# Patient Record
Sex: Male | Born: 1980 | Hispanic: Yes | Marital: Single | Smoking: Former smoker
Health system: Southern US, Community
[De-identification: ages and names within clinical notes are randomized; demographics above are authoritative.]

## PROBLEM LIST (undated history)

## (undated) DIAGNOSIS — J45909 Unspecified asthma, uncomplicated: Secondary | ICD-10-CM

---

## 2020-08-05 ENCOUNTER — Emergency Department (HOSPITAL_COMMUNITY): Payer: Self-pay

## 2020-08-05 ENCOUNTER — Emergency Department (HOSPITAL_COMMUNITY)
Admission: EM | Admit: 2020-08-05 | Discharge: 2020-08-05 | Disposition: A | Discharge status: Home / Self Care | Payer: Self-pay | Attending: Emergency Medicine | Admitting: Emergency Medicine

## 2020-08-05 ENCOUNTER — Other Ambulatory Visit: Payer: Self-pay

## 2020-08-05 ENCOUNTER — Encounter (HOSPITAL_COMMUNITY): Payer: Self-pay

## 2020-08-05 DIAGNOSIS — M549 Dorsalgia, unspecified: Secondary | ICD-10-CM | POA: Insufficient documentation

## 2020-08-05 DIAGNOSIS — R059 Cough, unspecified: Secondary | ICD-10-CM | POA: Insufficient documentation

## 2020-08-05 DIAGNOSIS — R079 Chest pain, unspecified: Secondary | ICD-10-CM | POA: Insufficient documentation

## 2020-08-05 MED ORDER — LIDOCAINE 5 % EX PTCH: 2.0000 | MEDICATED_PATCH | Freq: Every day | CUTANEOUS | 0 refills | Status: DC | PRN

## 2020-08-05 MED ORDER — METHOCARBAMOL 500 MG PO TABS: 500.0000 mg | ORAL_TABLET | Freq: Three times a day (TID) | ORAL | 0 refills | Status: DC | PRN

## 2020-08-05 MED ORDER — BENZONATATE 100 MG PO CAPS: 100.0000 mg | ORAL_CAPSULE | Freq: Three times a day (TID) | ORAL | 0 refills | Status: DC

## 2020-08-05 MED ORDER — PREDNISONE 50 MG PO TABS: 50.0000 mg | ORAL_TABLET | Freq: Every day | ORAL | 0 refills | Status: DC

## 2020-08-05 MED ORDER — LIDOCAINE 5 % EX PTCH
3.0000 | MEDICATED_PATCH | CUTANEOUS | Status: DC
  Administered 2020-08-05: 3 via TRANSDERMAL
  Filled 2020-08-05: qty 3

## 2020-08-05 MED ORDER — PREDNISONE 20 MG PO TABS
50.0000 mg | ORAL_TABLET | Freq: Once | ORAL | Status: AC
  Administered 2020-08-05: 50 mg via ORAL
  Filled 2020-08-05: qty 3

## 2020-08-05 NOTE — ED Provider Notes (Signed)
[facility] EMERGENCY DEPARTMENT Provider Note   CSN: [id number] Arrival date & time: 08/05/20  0141     History Chief Complaint  Patient presents with  . Cough  . Flank Pain    William Brandt is a 40 y.o. male with a hx of bronchitis who presents to the ED with complaints of cough x 1 week. Patient reports dry cough with subsequent development of right sided chest/back pain. He states he was having a hard coughing spell and thinks he might have torn a muscle as since then he has had pain to the right chest that is worse with coughing and certain movements, no alleviating factors despite trying several OTC medications. He denies fever, chills, N/V/D, congestion, ear pain, sore throat, syncope, dyspnea,  leg pain/swelling, or hemoptysis.    HPI     History reviewed. No pertinent past medical history.  There are no problems to display for this patient.   History reviewed. No pertinent surgical history.     History reviewed. No pertinent family history.     Home Medications Prior to Admission medications   Not on File    Allergies    Patient has no known allergies.  Review of Systems   Review of Systems  Constitutional: Negative for chills and fever.  HENT: Negative for congestion, ear pain and sore throat.   Respiratory: Positive for cough. Negative for shortness of breath.   Cardiovascular: Positive for chest pain. Negative for leg swelling.  Gastrointestinal: Negative for abdominal pain, diarrhea, nausea and vomiting.  Genitourinary: Negative for dysuria and hematuria.  Musculoskeletal: Positive for back pain.  Neurological: Negative for syncope.  All other systems reviewed and are negative.   Physical Exam Updated Vital Signs BP (!) 125/102 (BP Location: Left Arm)   Pulse 68   Temp 98.4 F (36.9 C)   Resp 15   SpO2 97%   Physical Exam Vitals and nursing note reviewed.  Constitutional:      General: He is not in acute  distress.    Appearance: He is well-developed. He is not toxic-appearing.  HENT:     Head: Normocephalic and atraumatic.     Right Ear: Ear canal normal. Tympanic membrane is not perforated, erythematous, retracted or bulging.     Left Ear: Ear canal normal. Tympanic membrane is not perforated, erythematous, retracted or bulging.     Ears:     Comments: No mastoid erythema/swellng/tenderness.     Nose:     Right Sinus: No maxillary sinus tenderness or frontal sinus tenderness.     Left Sinus: No maxillary sinus tenderness or frontal sinus tenderness.     Mouth/Throat:     Pharynx: Oropharynx is clear. Uvula midline. No oropharyngeal exudate or posterior oropharyngeal erythema.     Comments: Posterior oropharynx is symmetric appearing. Patient tolerating own secretions without difficulty. No trismus. No drooling. No hot potato voice. No swelling beneath the tongue, submandibular compartment is soft.  Eyes:     General:        Right eye: No discharge.        Left eye: No discharge.     Conjunctiva/sclera: Conjunctivae normal.  Cardiovascular:     Rate and Rhythm: Normal rate and regular rhythm.  Pulmonary:     Effort: Pulmonary effort is normal. No respiratory distress.     Breath sounds: Normal breath sounds. No wheezing, rhonchi or rales.  Chest:     Chest wall: Tenderness (right sided anterior, lateral & posterior chest  wall without overlying skin changes or palpable crepitus) present.  Abdominal:     General: There is no distension.     Palpations: Abdomen is soft.     Tenderness: There is no abdominal tenderness. There is no guarding or rebound.  Musculoskeletal:     Cervical back: Neck supple. No rigidity.     Right lower leg: No edema.     Left lower leg: No edema.  Lymphadenopathy:     Cervical: No cervical adenopathy.  Skin:    General: Skin is warm and dry.     Findings: No rash.  Neurological:     Mental Status: He is alert.  Psychiatric:        Behavior: Behavior  normal.     ED Results / Procedures / Treatments   Labs (all labs ordered are listed, but only abnormal results are displayed) Labs Reviewed - No data to display  EKG None  Radiology DG Ribs Unilateral W/Chest Right  Result Date: 08/05/2020 CLINICAL DATA:  Cough, rib pain EXAM: RIGHT RIBS AND CHEST - 3+ VIEW COMPARISON:  None FINDINGS: No fracture or other bone lesions are seen involving the ribs. There is no evidence of pneumothorax or pleural effusion. Both lungs are clear. Heart size and mediastinal contours are within normal limits. IMPRESSION: Negative. Electronically Signed   By: Charlett Nose M.D.   On: 08/05/2020 02:31    Procedures Procedures   Medications Ordered in ED Medications  lidocaine (LIDODERM) 5 % 3 patch (has no administration in time range)  predniSONE (DELTASONE) tablet 50 mg (has no administration in time range)    ED Course  I have reviewed the triage vital signs and the nursing notes.  Pertinent labs & imaging results that were available during my care of the patient were reviewed by me and considered in my medical decision making (see chart for details).    MDM Rules/Calculators/A&P                          Patient presents to the ED with complaints of cough with subsequent chest/back pain. Nontoxic, vitals without significant abnormality- BP elevated some- doubt HTN emergency, PCP recheck.   Additional history obtained:  Additional history obtained from chart review & nursing note review.   Imaging Studies ordered:  CXR was ordered by triage, I independently reviewed, formal radiology impression: Negative.   ED Course:  No signs of HEENT bacterial infection.  CXR without infiltrate to suggest pneumonia.  CXR also without pneumothorax, pulmonary edema, or rib fx.  Low risk wells- doubt PE.   Overall suspect viral vs allergic with subsequent MSK chest pain given pain started with coughing & is reproducible with chest wall palpation. Offered  COVID 19 testing, patient declined. Will tx supportively, PCP follow up. I discussed results, treatment plan, need for follow-up, and return precautions with the patient. Provided opportunity for questions, patient confirmed understanding and is in agreement with plan.   Portions of this note were generated with Scientist, clinical (histocompatibility and immunogenetics). Dictation errors may occur despite best attempts at proofreading.  Final Clinical Impression(s) / ED Diagnoses Final diagnoses:  Cough    Rx / DC Orders ED Discharge Orders         Ordered    predniSONE (DELTASONE) 50 MG tablet  Daily with breakfast        08/05/20 1423    methocarbamol (ROBAXIN) 500 MG tablet  Every 8 hours PRN  08/05/20 1423    benzonatate (TESSALON) 100 MG capsule  Every 8 hours        08/05/20 1423    lidocaine (LIDODERM) 5 %  Daily PRN        08/05/20 1423           Ranny Wiebelhaus, [place] R, PA-C 08/05/20 1427    Maia Plan, MD 08/08/20 1010

## 2020-08-05 NOTE — Discharge Instructions (Addendum)
You were seen in the ER today for cough & chest/back pain.  Your chest x-ray did not show any pneumonia, collapsed lung, or broken ribs. We are sending you home with the following medications to treat your symptoms:   - Prednisone- this is a steroid to help with pain/inflammation. Be sure to take this medication as prescribed with food in the morning.  It should be taken with food, as it can cause stomach upset, and more seriously, stomach bleeding. Do not take other nonsteroidal anti-inflammatory medications with this such as Advil, Motrin, Aleve, Mobic, Goodie Powder, or Motrin.    - Robaxin is the muscle relaxer I have prescribed, this is meant to help with muscle tightness. Be aware that this medication may make you drowsy therefore the first time you take this it should be at a time you are in an environment where you can rest. Do not drive or operate heavy machinery when taking this medication. Do not drink alcohol or take other sedating medications with this medicine such as narcotics or benzodiazepines.   - Lidoderm patch- apply 2 patches to your area of pain once per day. Remove & discard patches within 12 hours of application.   - Tessalon- take every 8 hours as needed for cough.   You make take Tylenol per over the counter dosing with these medications.   We have prescribed you new medication(s) today. Discuss the medications prescribed today with your pharmacist as they can have adverse effects and interactions with your other medicines including over the counter and prescribed medications. Seek medical evaluation if you start to experience new or abnormal symptoms after taking one of these medicines, seek care immediately if you start to experience difficulty breathing, feeling of your throat closing, facial swelling, or rash as these could be indications of a more serious allergic reaction   Please follow up with primary care within 1 week for recheck of your symptoms and for a recheck  of your blood pressure as it was elevated in the Er today.  Return to the Er for new or worsening symptoms including but not limited to trouble breathing, passing out, coughing up blood, fever, new or worsening pain, or any other concerns.

## 2020-08-05 NOTE — ED Provider Notes (Signed)
MSE was initiated and I personally evaluated the patient and placed orders (if any) at  2:08 AM on August 05, 2020.  Patient with CC of cough and right rib pain.  Denies fever.  No respiratory distress.  Appears stable.  Discussed with patient that their care has been initiated.   They are counseled that they will need to remain in the ED until the completion of their workup, including full H&P and results of any tests.  Risks of leaving the emergency department prior to completion of treatment were discussed. Patient was advised to inform ED staff if they are leaving before their treatment is complete. The patient acknowledged these risks and time was allowed for questions.    The patient appears stable so that the remainder of the MSE may be completed by another provider.    Roxy Horseman, PA-C 08/05/20 [id number]    Zadie Rhine, MD 08/06/20 [phone]

## 2020-08-05 NOTE — ED Triage Notes (Signed)
Pt reports he has not been feeling well and tried a bunch of home remedies at home and then develop a severe cough which is now causing right side flank  pain and rib pain

## 2020-10-07 ENCOUNTER — Ambulatory Visit (HOSPITAL_COMMUNITY)
Admission: EM | Admit: 2020-10-07 | Discharge: 2020-10-07 | Disposition: A | Discharge status: Home / Self Care | Payer: Self-pay | Attending: Student | Admitting: Student

## 2020-10-07 ENCOUNTER — Encounter (HOSPITAL_COMMUNITY): Payer: Self-pay | Admitting: Emergency Medicine

## 2020-10-07 DIAGNOSIS — F17200 Nicotine dependence, unspecified, uncomplicated: Secondary | ICD-10-CM

## 2020-10-07 DIAGNOSIS — R053 Chronic cough: Secondary | ICD-10-CM

## 2020-10-07 DIAGNOSIS — J301 Allergic rhinitis due to pollen: Secondary | ICD-10-CM

## 2020-10-07 DIAGNOSIS — S29012A Strain of muscle and tendon of back wall of thorax, initial encounter: Secondary | ICD-10-CM

## 2020-10-07 HISTORY — DX: Unspecified asthma, uncomplicated: J45.909

## 2020-10-07 MED ORDER — ALBUTEROL SULFATE HFA 108 (90 BASE) MCG/ACT IN AERS: 1.0000 | INHALATION_SPRAY | Freq: Four times a day (QID) | RESPIRATORY_TRACT | 0 refills | Status: DC | PRN

## 2020-10-07 MED ORDER — CETIRIZINE HCL 10 MG PO TABS: 10.0000 mg | ORAL_TABLET | Freq: Every day | ORAL | 2 refills | Status: DC

## 2020-10-07 MED ORDER — PREDNISONE 10 MG (21) PO TBPK: ORAL_TABLET | Freq: Every day | ORAL | 0 refills | Status: DC

## 2020-10-07 MED ORDER — TIZANIDINE HCL 2 MG PO CAPS: 2.0000 mg | ORAL_CAPSULE | Freq: Three times a day (TID) | ORAL | 0 refills | Status: DC

## 2020-10-07 NOTE — Discharge Instructions (Addendum)
-  Prednisone taper for muscle strain. I recommend taking this in the morning as it could give you energy.  -Start the muscle relaxer-Zanaflex (tizanidine), up to 3 times daily for muscle spasms and pain.  This can make you drowsy, so take at bedtime or when you do not need to drive or operate machinery. -Albuterol inhaler as needed for cough and shortness of breath, up to every 6 hours. -Trial of Zyrtec allergy medication, take this once daily for 1 week, continue for longer if it is helping. -Seek additional immediate medical attention if you develop the worst pain of your life, new numbness and tingling in your legs, urinary retention, chest pain, shortness of breath, dizziness.  Follow-up with primary care provider if symptoms persist.  I also recommend trying to cut down on your smoking.

## 2020-10-07 NOTE — ED Triage Notes (Signed)
Pt presents today with c/o right lower back pain. He reports hearing something pop when he coughed yesterday.

## 2020-10-07 NOTE — ED Provider Notes (Signed)
[facility]    CSN: [id number] Arrival date & time: 10/07/20  1440      History   Chief Complaint Chief Complaint  Patient presents with  . Back Pain    HPI William Brandt is a 40 y.o. male presenting for chronic cough and back pain. Documented history asthma though patient denies this. He is a current marijuana smoker and was a tobacco smoker until 1 year ago.  Notes months of chronic cough productive of clear sputum and scant nasal congestion.  States the cough is gotten so bad that it occasionally will irritate his back.  He was seen in the emergency department on 08/05/2020 for the symptoms, at that time they prescribed muscle relaxer, prednisone, lidocaine patches with some improvement.  They also did a chest x-ray which was normal.  He is concerned today because he coughed 1 day ago and heard his back pop.  He is currently experiencing right-sided thoracic paraspinous muscle tenderness.  Continues to have this cough. States he used a friend's albuterol inhaler and it was very helpful. Minimal improvement in pain with motrin and lidocaine patches. Denies fevers/chills, n/v/d, shortness of breath, chest pain, facial pain, teeth pain, headaches, sore throat, loss of taste/smell, swollen lymph nodes, ear pain.    HPI  Past Medical History:  Diagnosis Date  . Asthma     There are no problems to display for this patient.   History reviewed. No pertinent surgical history.     Home Medications    Prior to Admission medications   Medication Sig Start Date End Date Taking? Authorizing Provider  albuterol (VENTOLIN HFA) 108 (90 Base) MCG/ACT inhaler Inhale 1-2 puffs into the lungs every 6 (six) hours as needed for wheezing or shortness of breath. 10/07/20  Yes Rhys Martini, PA-C  cetirizine (ZYRTEC ALLERGY) 10 MG tablet Take 1 tablet (10 mg total) by mouth daily. 10/07/20  Yes Rhys Martini, PA-C  lidocaine (LIDODERM) 5 % Place 2 patches onto the skin daily as  needed. Apply patch to area most significant pain once per day.  Remove and discard patch within 12 hours of application. 08/05/20  Yes Petrucelli, Samantha R, PA-C  predniSONE (STERAPRED UNI-PAK 21 TAB) 10 MG (21) TBPK tablet Take by mouth daily. Take 6 tabs by mouth daily  for 2 days, then 5 tabs for 2 days, then 4 tabs for 2 days, then 3 tabs for 2 days, 2 tabs for 2 days, then 1 tab by mouth daily for 2 days 10/07/20  Yes Cheree Ditto, Lyman Speller, PA-C  tizanidine (ZANAFLEX) 2 MG capsule Take 1 capsule (2 mg total) by mouth 3 (three) times daily. 10/07/20  Yes Rhys Martini, PA-C  benzonatate (TESSALON) 100 MG capsule Take 1 capsule (100 mg total) by mouth every 8 (eight) hours. 08/05/20   Petrucelli, Samantha R, PA-C  methocarbamol (ROBAXIN) 500 MG tablet Take 1 tablet (500 mg total) by mouth every 8 (eight) hours as needed for muscle spasms. 08/05/20   Petrucelli, Pleas Koch, PA-C    Family History History reviewed. No pertinent family history.  Social History Social History   Tobacco Use  . Smoking status: Never Smoker  . Smokeless tobacco: Never Used  Vaping Use  . Vaping Use: Never used  Substance Use Topics  . Alcohol use: Not Currently  . Drug use: Not Currently     Allergies   Patient has no known allergies.   Review of Systems Review of Systems  Constitutional: Negative for appetite  change, chills, fever and unexpected weight change.  HENT: Positive for congestion. Negative for ear pain, rhinorrhea, sinus pressure, sinus pain and sore throat.   Eyes: Negative for redness and visual disturbance.  Respiratory: Positive for cough. Negative for chest tightness, shortness of breath and wheezing.   Cardiovascular: Negative for chest pain and palpitations.  Gastrointestinal: Negative for abdominal pain, constipation, diarrhea, nausea and vomiting.  Genitourinary: Negative for decreased urine volume, difficulty urinating, dysuria, frequency and urgency.  Musculoskeletal: Positive for back  pain. Negative for arthralgias, gait problem, joint swelling, myalgias, neck pain and neck stiffness.  Skin: Negative for wound.  Neurological: Negative for dizziness, tremors, seizures, syncope, facial asymmetry, speech difficulty, weakness, light-headedness, numbness and headaches.  Psychiatric/Behavioral: Negative for confusion.  All other systems reviewed and are negative.    Physical Exam Triage Vital Signs ED Triage Vitals  Enc Vitals Group     BP      Pulse      Resp      Temp      Temp src      SpO2      Weight      Height      Head Circumference      Peak Flow      Pain Score      Pain Loc      Pain Edu?      Excl. in GC?    No data found.  Updated Vital Signs BP (!) 128/93 (BP Location: Right Arm)   Pulse 71   Temp 98.2 F (36.8 C) (Oral)   Resp 18   SpO2 98%   Visual Acuity Right Eye Distance:   Left Eye Distance:   Bilateral Distance:    Right Eye Near:   Left Eye Near:    Bilateral Near:     Physical Exam Vitals reviewed.  Constitutional:      General: He is not in acute distress.    Appearance: Normal appearance. He is not ill-appearing.  HENT:     Head: Normocephalic and atraumatic.     Right Ear: Hearing, tympanic membrane, ear canal and external ear normal. No swelling or tenderness. There is no impacted cerumen. No mastoid tenderness. Tympanic membrane is not perforated, erythematous, retracted or bulging.     Left Ear: Hearing, tympanic membrane, ear canal and external ear normal. No swelling or tenderness. There is no impacted cerumen. No mastoid tenderness. Tympanic membrane is not perforated, erythematous, retracted or bulging.     Nose:     Right Sinus: No maxillary sinus tenderness or frontal sinus tenderness.     Left Sinus: No maxillary sinus tenderness or frontal sinus tenderness.     Mouth/Throat:     Mouth: Mucous membranes are moist.     Pharynx: Uvula midline. No oropharyngeal exudate or posterior oropharyngeal erythema.      Tonsils: No tonsillar exudate.  Cardiovascular:     Rate and Rhythm: Normal rate and regular rhythm.     Heart sounds: Normal heart sounds.  Pulmonary:     Effort: Pulmonary effort is normal.     Breath sounds: Normal breath sounds and air entry. No decreased breath sounds, wheezing, rhonchi or rales.     Comments: occ cough Chest:     Chest wall: No tenderness.  Abdominal:     General: Abdomen is flat. Bowel sounds are normal.     Tenderness: There is no abdominal tenderness. There is no right CVA tenderness, left CVA tenderness, guarding or rebound.  Comments: No bowel or bladder incontinence.  Musculoskeletal:     Cervical back: Normal range of motion. No swelling, deformity, signs of trauma, rigidity, spasms, tenderness, bony tenderness or crepitus. No pain with movement.     Thoracic back: No swelling, deformity, signs of trauma, spasms, tenderness or bony tenderness. Normal range of motion. No scoliosis.     Lumbar back: Spasms and tenderness present. No swelling, deformity, signs of trauma or bony tenderness. Normal range of motion. Negative right straight leg raise test and negative left straight leg raise test. No scoliosis.     Comments: R sided thoracic paraspinous muscle tenderness to palpation.  No other spinous or paraspinous tenderness.  No spinous deformity, step-off.  Strength 5 out of 5 in upper and lower extremities, sensation intact.  Negative straight leg raise bilaterally.  Gait intact.  Absolutely no other injury, deformity, tenderness, ecchymosis, abrasion.  Lymphadenopathy:     Cervical: No cervical adenopathy.  Neurological:     General: No focal deficit present.     Mental Status: He is alert and oriented to person, place, and time.     Cranial Nerves: No cranial nerve deficit.  Psychiatric:        Attention and Perception: Attention and perception normal.        Mood and Affect: Mood and affect normal.        Behavior: Behavior normal. Behavior is  cooperative.        Thought Content: Thought content normal.        Judgment: Judgment normal.      UC Treatments / Results  Labs (all labs ordered are listed, but only abnormal results are displayed) Labs Reviewed - No data to display  EKG   Radiology No results found.  Procedures Procedures (including critical care time)  Medications Ordered in UC Medications - No data to display  Initial Impression / Assessment and Plan / UC Course  I have reviewed the triage vital signs and the nursing notes.  Pertinent labs & imaging results that were available during my care of the patient were reviewed by me and considered in my medical decision making (see chart for details).     This patient is a 40 year old male presenting with chronic cough for months and acute onset of chronic back pain.  No red flag symptoms. He is a current marijuana smoker and was a tobacco smoker until 1 year ago. rec cessation.  Suspect there is also an allergic rhinitis component.  Trial of albuterol inhaler, prednisone, zanaflex, zyrtec as below. He is not a diabetic.  Recommended heat, gentle range of motion exercises.  Follow-up with primary care provider if symptoms persist.  Red flag symptoms and ED return precautions discussed.  Final Clinical Impressions(s) / UC Diagnoses   Final diagnoses:  Strain of thoracic back region  Chronic cough  Smoker  Seasonal allergic rhinitis due to pollen     Discharge Instructions     -Prednisone taper for muscle strain. I recommend taking this in the morning as it could give you energy.  -Start the muscle relaxer-Zanaflex (tizanidine), up to 3 times daily for muscle spasms and pain.  This can make you drowsy, so take at bedtime or when you do not need to drive or operate machinery. -Albuterol inhaler as needed for cough and shortness of breath, up to every 6 hours. -Trial of Zyrtec allergy medication, take this once daily for 1 week, continue for longer if it is  helping. -Seek additional immediate medical  attention if you develop the worst pain of your life, new numbness and tingling in your legs, urinary retention, chest pain, shortness of breath, dizziness.  Follow-up with primary care provider if symptoms persist.  I also recommend trying to cut down on your smoking.    ED Prescriptions    Medication Sig Dispense Auth. Provider   predniSONE (STERAPRED UNI-PAK 21 TAB) 10 MG (21) TBPK tablet Take by mouth daily. Take 6 tabs by mouth daily  for 2 days, then 5 tabs for 2 days, then 4 tabs for 2 days, then 3 tabs for 2 days, 2 tabs for 2 days, then 1 tab by mouth daily for 2 days 42 tablet Rhys MartiniGraham, Zanyla Klebba E, PA-C   tizanidine (ZANAFLEX) 2 MG capsule Take 1 capsule (2 mg total) by mouth 3 (three) times daily. 21 capsule Rhys MartiniGraham, Ferrell Flam E, PA-C   cetirizine (ZYRTEC ALLERGY) 10 MG tablet Take 1 tablet (10 mg total) by mouth daily. 30 tablet Rhys MartiniGraham, Simmone [place] E, PA-C   albuterol (VENTOLIN HFA) 108 (90 Base) MCG/ACT inhaler Inhale 1-2 puffs into the lungs every 6 (six) hours as needed for wheezing or shortness of breath. 1 each Rhys MartiniGraham, Jovonne Wilton E, PA-C     PDMP not reviewed this encounter.   Rhys MartiniGraham, Rikia Sukhu E, PA-C 10/07/20 1629

## 2020-12-06 ENCOUNTER — Ambulatory Visit
Admission: EM | Admit: 2020-12-06 | Discharge: 2020-12-06 | Disposition: A | Discharge status: Home / Self Care | Payer: Self-pay | Attending: Family Medicine | Admitting: Family Medicine

## 2020-12-06 ENCOUNTER — Other Ambulatory Visit: Payer: Self-pay

## 2020-12-06 DIAGNOSIS — R053 Chronic cough: Secondary | ICD-10-CM

## 2020-12-06 DIAGNOSIS — J4541 Moderate persistent asthma with (acute) exacerbation: Secondary | ICD-10-CM

## 2020-12-06 DIAGNOSIS — S29019A Strain of muscle and tendon of unspecified wall of thorax, initial encounter: Secondary | ICD-10-CM

## 2020-12-06 DIAGNOSIS — J3089 Other allergic rhinitis: Secondary | ICD-10-CM

## 2020-12-06 DIAGNOSIS — F17210 Nicotine dependence, cigarettes, uncomplicated: Secondary | ICD-10-CM

## 2020-12-06 MED ORDER — PREDNISONE 20 MG PO TABS: 40.0000 mg | ORAL_TABLET | Freq: Every day | ORAL | 0 refills | Status: DC

## 2020-12-06 MED ORDER — PROMETHAZINE-DM 6.25-15 MG/5ML PO SYRP: 5.0000 mL | ORAL_SOLUTION | Freq: Four times a day (QID) | ORAL | 0 refills | Status: DC | PRN

## 2020-12-06 MED ORDER — ALBUTEROL SULFATE HFA 108 (90 BASE) MCG/ACT IN AERS: 1.0000 | INHALATION_SPRAY | Freq: Four times a day (QID) | RESPIRATORY_TRACT | 2 refills | Status: DC | PRN

## 2020-12-06 MED ORDER — CETIRIZINE HCL 10 MG PO TABS: 10.0000 mg | ORAL_TABLET | Freq: Every day | ORAL | 2 refills | Status: DC

## 2020-12-06 MED ORDER — MONTELUKAST SODIUM 10 MG PO TABS: 10.0000 mg | ORAL_TABLET | Freq: Every day | ORAL | 2 refills | Status: DC

## 2020-12-06 MED ORDER — CYCLOBENZAPRINE HCL 10 MG PO TABS: 10.0000 mg | ORAL_TABLET | Freq: Three times a day (TID) | ORAL | 0 refills | Status: DC | PRN

## 2020-12-06 MED ORDER — BUDESONIDE-FORMOTEROL FUMARATE 160-4.5 MCG/ACT IN AERO: 2.0000 | INHALATION_SPRAY | Freq: Two times a day (BID) | RESPIRATORY_TRACT | 2 refills | Status: DC

## 2020-12-06 NOTE — ED Triage Notes (Signed)
Pt c/o chronic cough. States yesterday coughed and felted a pop to rt ribs. Pt states hx of same a month ago and received treatment in the ED.

## 2020-12-06 NOTE — ED Provider Notes (Addendum)
[facility]    CSN: [id number] Arrival date & time: 12/06/20  0825      History   Chief Complaint Chief Complaint  Patient presents with   Cough   Back Pain    HPI William Brandt is a 40 y.o. male.   Patient presenting today with acute on chronic hacking cough, wheezing, chest tightness.  States is been ongoing for most of his life, known history of asthma, allergies, chronic cigarette smoking and is supposed to be on maintenance inhalers but keeps running out of them.  He is also out of his Zyrtec for his allergies.  He states he coughs so hard that he pulls muscles in his mid to lower back, worse on the right side.  Muscle relaxers do help temporarily but once he starts coughing again the pain returns.  Using lidocaine Patches and ibuprofen with mild temporary relief.  Denies fever, chills, body aches, chest pain, significant shortness of breath, abdominal pain, nausea vomiting or diarrhea, Radiation of pain down legs, numbness, weakness, tingling, bowel or bladder incontinence, saddle paresthesias, sick contacts.  Past Medical History:  Diagnosis Date   Asthma    There are no problems to display for this patient.  History reviewed. No pertinent surgical history.     Home Medications    Prior to Admission medications   Medication Sig Start Date End Date Taking? Authorizing Provider  albuterol (VENTOLIN HFA) 108 (90 Base) MCG/ACT inhaler Inhale 1-2 puffs into the lungs every 6 (six) hours as needed for wheezing or shortness of breath. 12/06/20  Yes Particia Nearing, PA-C  budesonide-formoterol [facility]) 160-4.5 MCG/ACT inhaler Inhale 2 puffs into the lungs 2 (two) times daily. 12/06/20  Yes Particia Nearing, PA-C  cyclobenzaprine (FLEXERIL) 10 MG tablet Take 1 tablet (10 mg total) by mouth 3 (three) times daily as needed for muscle spasms. Do not drink alcohol or drive while taking this medication.  May cause drowsiness. 12/06/20  Yes Particia Nearing, PA-C  montelukast (SINGULAIR) 10 MG tablet Take 1 tablet (10 mg total) by mouth at bedtime. 12/06/20  Yes Particia Nearing, PA-C  predniSONE (DELTASONE) 20 MG tablet Take 2 tablets (40 mg total) by mouth daily with breakfast. 12/06/20  Yes Particia Nearing, PA-C  promethazine-dextromethorphan (PROMETHAZINE-DM) 6.25-15 MG/5ML syrup Take 5 mLs by mouth 4 (four) times daily as needed for cough. 12/06/20  Yes Particia Nearing, PA-C  cetirizine (ZYRTEC ALLERGY) 10 MG tablet Take 1 tablet (10 mg total) by mouth daily. 12/06/20   Particia Nearing, PA-C  lidocaine (LIDODERM) 5 % Place 2 patches onto the skin daily as needed. Apply patch to area most significant pain once per day.  Remove and discard patch within 12 hours of application. 08/05/20   Petrucelli, Pleas Koch, PA-C   Family History History reviewed. No pertinent family history.  Social History Social History   Tobacco Use   Smoking status: Never   Smokeless tobacco: Never  Vaping Use   Vaping Use: Never used  Substance Use Topics   Alcohol use: Not Currently   Drug use: Not Currently     Allergies   Patient has no known allergies.   Review of Systems Review of Systems Per HPI  Physical Exam Triage Vital Signs ED Triage Vitals  Enc Vitals Group     BP 12/06/20 0905 (!) 139/103     Pulse Rate 12/06/20 0905 83     Resp 12/06/20 0905 18     Temp 12/06/20  0905 98.3 F (36.8 C)     Temp Source 12/06/20 0905 Oral     SpO2 12/06/20 0905 95 %     Weight --      Height --      Head Circumference --      Peak Flow --      Pain Score 12/06/20 0906 10     Pain Loc --      Pain Edu? --      Excl. in GC? --    No data found.  Updated Vital Signs BP (!) 139/103 (BP Location: Left Arm)   Pulse 83   Temp 98.3 F (36.8 C) (Oral)   Resp 18   SpO2 95%   Visual Acuity Right Eye Distance:   Left Eye Distance:   Bilateral Distance:    Right Eye Near:   Left Eye Near:    Bilateral Near:      Physical Exam Vitals and nursing note reviewed.  Constitutional:      Appearance: Normal appearance.  HENT:     Head: Atraumatic.     Right Ear: Tympanic membrane normal.     Left Ear: Tympanic membrane normal.     Nose: Rhinorrhea present.     Mouth/Throat:     Mouth: Mucous membranes are moist.     Pharynx: Posterior oropharyngeal erythema present. No oropharyngeal exudate.  Eyes:     Extraocular Movements: Extraocular movements intact.     Conjunctiva/sclera: Conjunctivae normal.  Cardiovascular:     Rate and Rhythm: Normal rate and regular rhythm.     Heart sounds: Normal heart sounds.  Pulmonary:     Effort: Pulmonary effort is normal. No respiratory distress.     Breath sounds: Wheezing present. No rales.     Comments: Mild diffuse wheezes Abdominal:     General: Bowel sounds are normal. There is no distension.     Palpations: Abdomen is soft.     Tenderness: There is no abdominal tenderness. There is no guarding.  Musculoskeletal:        General: Tenderness present. Normal range of motion.     Cervical back: Normal range of motion and neck supple.     Comments: Moderate to severe tenderness to palpation over right thoracic and lumbar musculature laterally, antalgic movements at the torso.  Negative midline spinal tenderness palpation, negative straight leg raise  Skin:    General: Skin is warm and dry.     Findings: No bruising, erythema or rash.  Neurological:     General: No focal deficit present.     Mental Status: He is oriented to person, place, and time.     Motor: No weakness.     Gait: Gait normal.  Psychiatric:        Mood and Affect: Mood normal.        Thought Content: Thought content normal.        Judgment: Judgment normal.   UC Treatments / Results  Labs (all labs ordered are listed, but only abnormal results are displayed) Labs Reviewed - No data to display  EKG  Radiology No results found.  Procedures Procedures (including critical care  time)  Medications Ordered in UC Medications - No data to display  Initial Impression / Assessment and Plan / UC Course  I have reviewed the triage vital signs and the nursing notes.  Pertinent labs & imaging results that were available during my care of the patient were reviewed by me and considered in my  medical decision making (see chart for details).     Cough secondary to uncontrolled seasonal allergies, asthma, likely COPD given his chronic smoking history.  Chest x-rays done at recent ED and urgent care visits negative for acute abnormality, no worsening symptoms since then.  Will restart albuterol, Symbicort for maintenance inhalers and Zyrtec, Singulair for allergy regimen.  Phenergan DM, prednisone burst, muscle relaxer additionally for symptomatic benefit.  Discussed importance of following up with a primary care provider for ongoing refills and med management.  Follow-up if worsening or not resolving.  Final Clinical Impressions(s) / UC Diagnoses   Final diagnoses:  Chronic cough  Seasonal allergic rhinitis due to other allergic trigger  Cigarette smoker  Thoracic myofascial strain, initial encounter  Moderate persistent asthma with acute exacerbation   Discharge Instructions   None    ED Prescriptions     Medication Sig Dispense Auth. Provider   cetirizine (ZYRTEC ALLERGY) 10 MG tablet Take 1 tablet (10 mg total) by mouth daily. 30 tablet Particia Nearing, PA-C   montelukast (SINGULAIR) 10 MG tablet Take 1 tablet (10 mg total) by mouth at bedtime. 30 tablet Particia Nearing, [place]   predniSONE (DELTASONE) 20 MG tablet Take 2 tablets (40 mg total) by mouth daily with breakfast. 10 tablet Particia Nearing, PA-C   albuterol (VENTOLIN HFA) 108 (90 Base) MCG/ACT inhaler Inhale 1-2 puffs into the lungs every 6 (six) hours as needed for wheezing or shortness of breath. 18 g Particia Nearing, PA-C   budesonide-formoterol [facility]) 160-4.5 MCG/ACT  inhaler Inhale 2 puffs into the lungs 2 (two) times daily. 1 each Particia Nearing, PA-C   promethazine-dextromethorphan (PROMETHAZINE-DM) 6.25-15 MG/5ML syrup Take 5 mLs by mouth 4 (four) times daily as needed for cough. 100 mL Particia Nearing, PA-C   cyclobenzaprine (FLEXERIL) 10 MG tablet Take 1 tablet (10 mg total) by mouth 3 (three) times daily as needed for muscle spasms. Do not drink alcohol or drive while taking this medication.  May cause drowsiness. 30 tablet Particia Nearing, [place]      PDMP not reviewed this encounter.   Particia Nearing, PA-C 12/06/20 0947    Particia Nearing, PA-C 12/06/20 [phone]

## 2020-12-23 ENCOUNTER — Other Ambulatory Visit: Payer: Self-pay | Admitting: Family Medicine

## 2020-12-23 NOTE — Telephone Encounter (Signed)
Seen at UC.

## 2021-10-29 IMAGING — CR DG RIBS W/ CHEST 3+V*R*
5 series · 5 of 5 positions shown · non-contrast
Comparison: None

CLINICAL DATA: Cough, rib pain

EXAM:
RIGHT RIBS AND CHEST - 3+ VIEW

[chest pa]
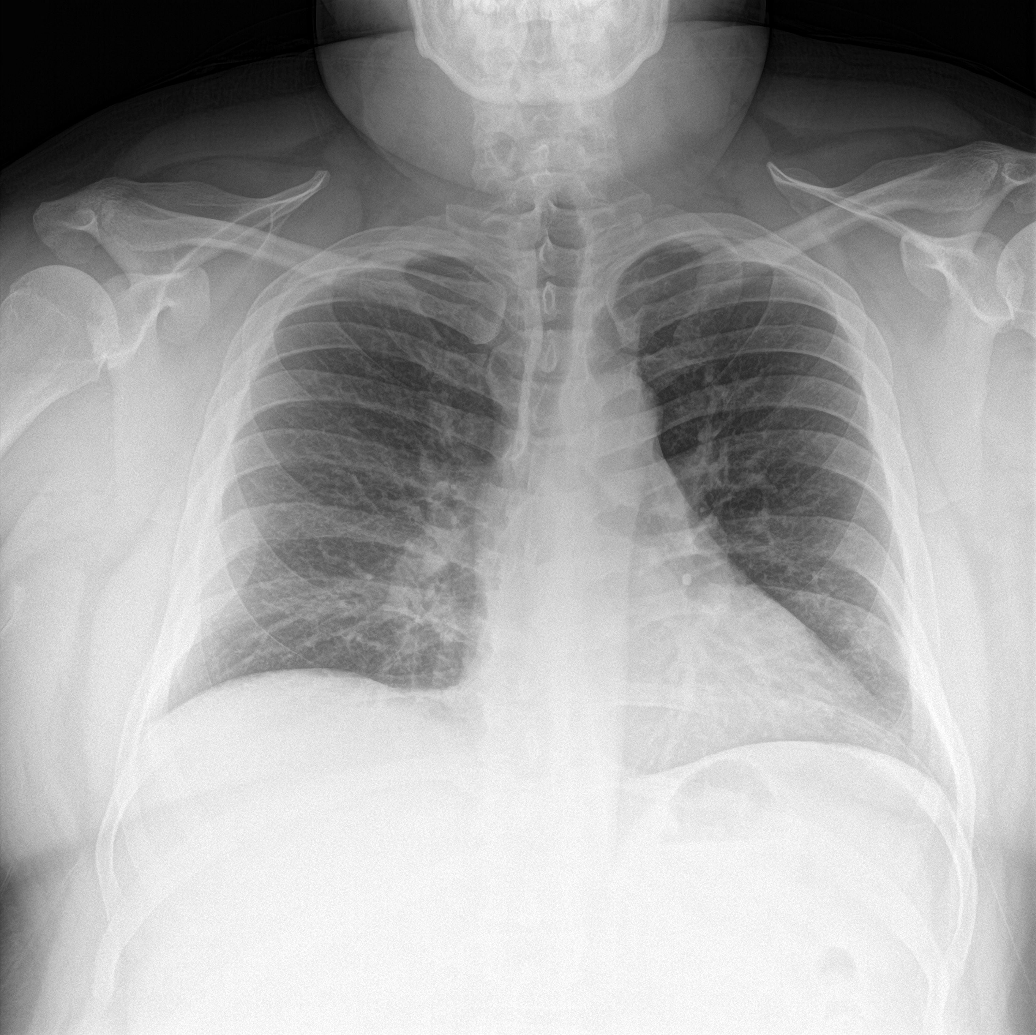

[rib ap (1 of 2)]
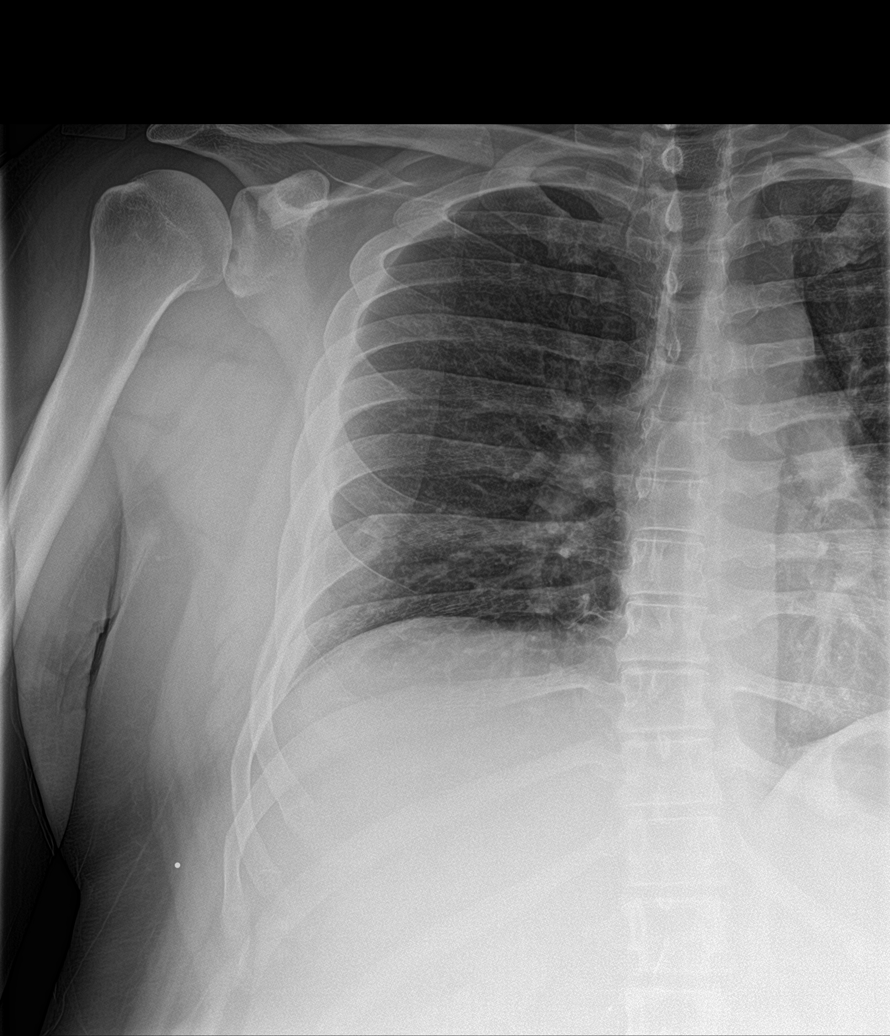

[rib ap obl (1 of 2)]
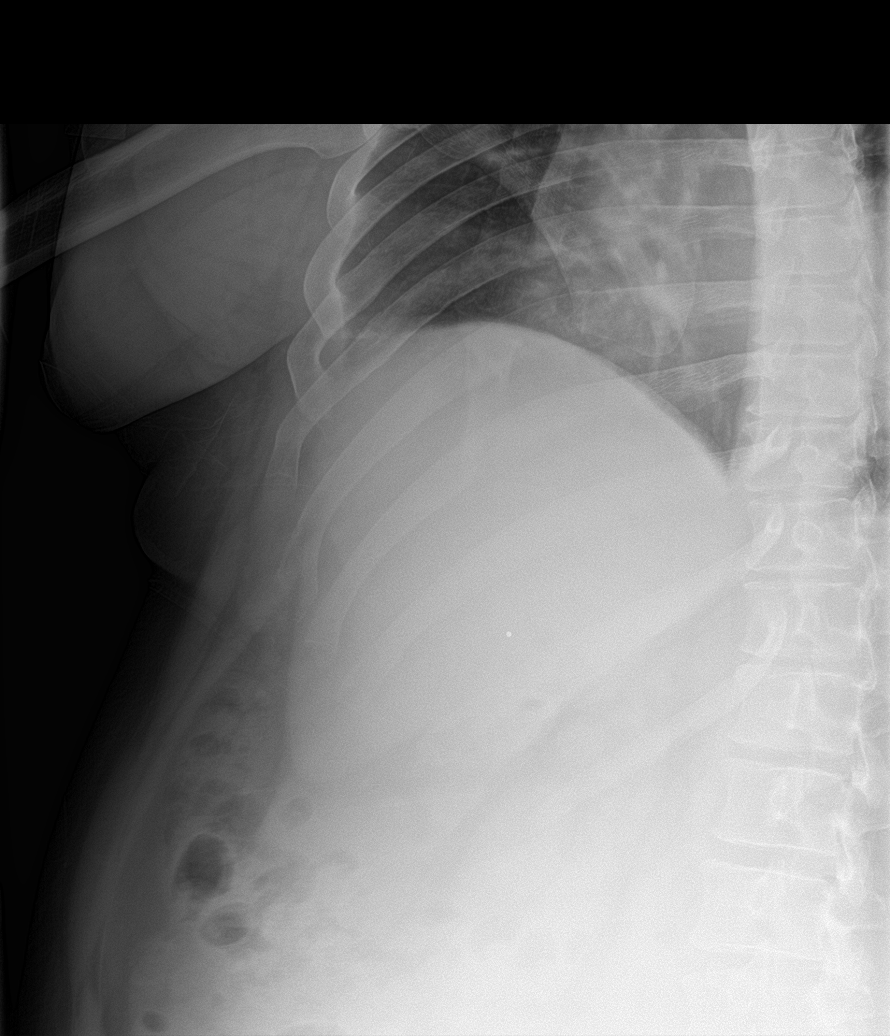

[rib ap (2 of 2)]
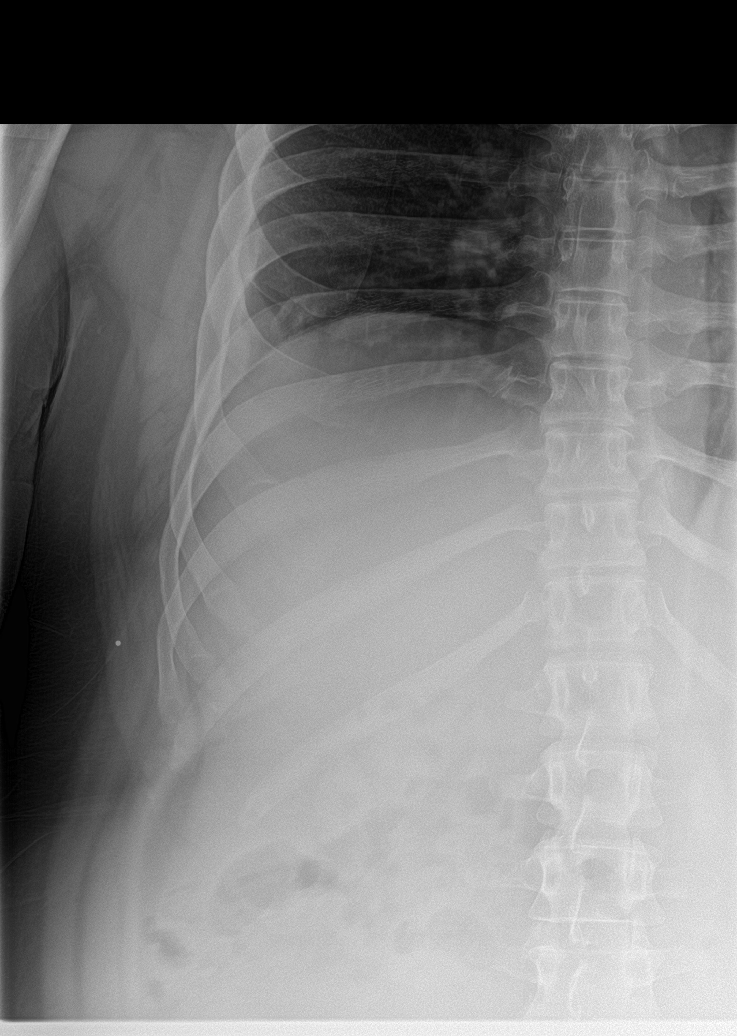

[rib ap obl (2 of 2)]
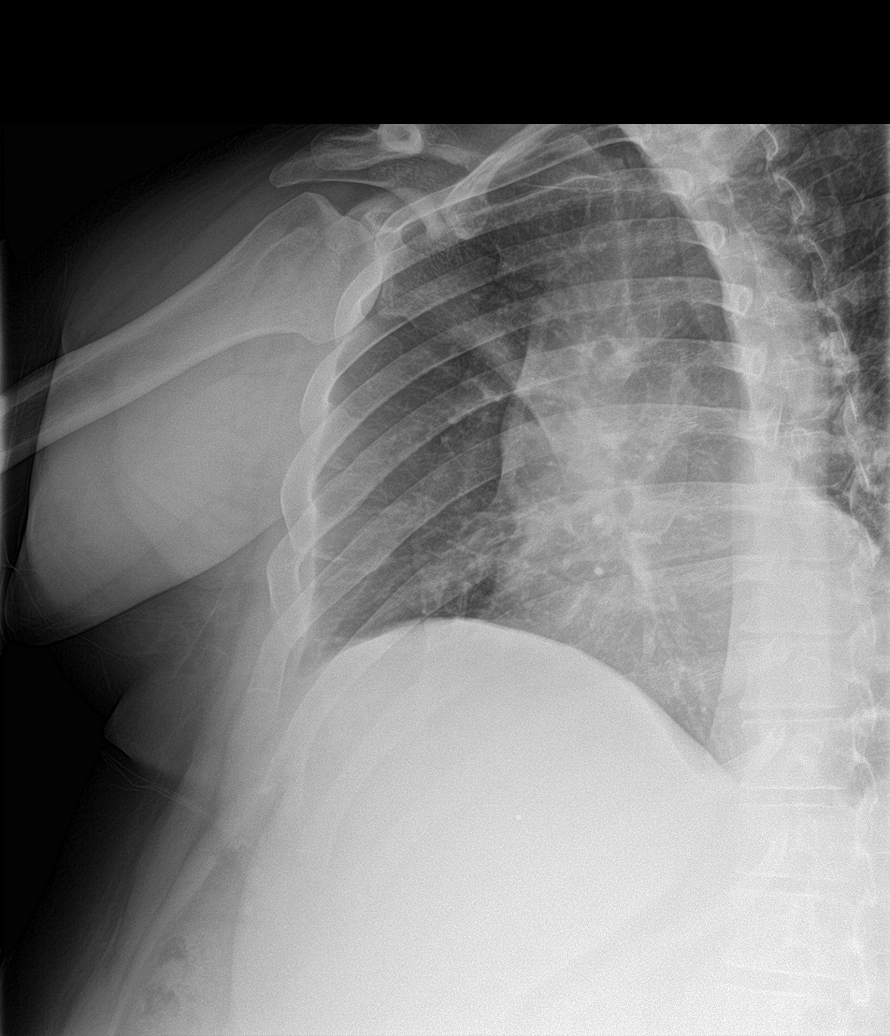

[5 of 5 positions shown; findings below may reference images not displayed]

FINDINGS: No fracture or other bone lesions are seen involving the ribs. There
is no evidence of pneumothorax or pleural effusion. Both lungs are
clear. Heart size and mediastinal contours are within normal limits.
IMPRESSION: Negative.

## 2022-04-28 ENCOUNTER — Ambulatory Visit
Admission: EM | Admit: 2022-04-28 | Discharge: 2022-04-28 | Disposition: A | Discharge status: Home / Self Care | Payer: Managed Care, Other (non HMO) | Attending: Physician Assistant | Admitting: Physician Assistant

## 2022-04-28 ENCOUNTER — Encounter: Payer: Self-pay | Admitting: Physician Assistant

## 2022-04-28 DIAGNOSIS — J4521 Mild intermittent asthma with (acute) exacerbation: Secondary | ICD-10-CM | POA: Diagnosis not present

## 2022-04-28 MED ORDER — PREDNISONE 10 MG (21) PO TBPK
ORAL_TABLET | ORAL | 0 refills | Status: DC
Start: 2022-04-28 — End: 2022-04-28

## 2022-04-28 MED ORDER — ALBUTEROL SULFATE HFA 108 (90 BASE) MCG/ACT IN AERS: 1.0000 | INHALATION_SPRAY | Freq: Four times a day (QID) | RESPIRATORY_TRACT | 2 refills | Status: DC | PRN

## 2022-04-28 MED ORDER — MONTELUKAST SODIUM 10 MG PO TABS: 10.0000 mg | ORAL_TABLET | Freq: Every day | ORAL | 2 refills | Status: DC

## 2022-04-28 MED ORDER — PREDNISONE 10 MG (21) PO TBPK: ORAL_TABLET | ORAL | 0 refills | Status: DC

## 2022-04-28 MED ORDER — BUDESONIDE-FORMOTEROL FUMARATE 160-4.5 MCG/ACT IN AERO: 2.0000 | INHALATION_SPRAY | Freq: Two times a day (BID) | RESPIRATORY_TRACT | 2 refills | Status: DC

## 2022-04-28 MED ORDER — BUDESONIDE-FORMOTEROL FUMARATE 160-4.5 MCG/ACT IN AERO
2.0000 | INHALATION_SPRAY | Freq: Two times a day (BID) | RESPIRATORY_TRACT | 2 refills | Status: DC
Start: 2022-04-28 — End: 2022-08-08

## 2022-04-28 NOTE — ED Triage Notes (Signed)
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

## 2022-04-28 NOTE — Discharge Instructions (Signed)
Use albuterol every 4-6 hours as needed.  Start prednisone taper as prescribed.  Do not take NSAIDs with this medication including aspirin, ibuprofen/Advil, naproxen/Aleve.  Use Symbicort twice daily.  Make sure to rinse your mouth following use of this medication to prevent thrush.  Take mildly Daily.  I recommend over-the-counter medications including Flonase and antihistamine such as cetirizine to help with your symptoms.  If your symptoms or not improving or if anything worsens you need to be seen immediately.

## 2022-04-28 NOTE — ED Provider Notes (Signed)
[facility]    CSN: [id number] Arrival date & time: 04/28/22  1244      History   Chief Complaint Chief Complaint  Patient presents with   Cough    HPI William Brandt is a 41 y.o. male.   Patient presents today with a 4 to 5-day history of URI symptoms including cough and congestion.  He reports that he was diagnosed with asthma when he was younger but has not required albuterol inhaler until recently.  He was seen by our clinic in August 2022 at which point he was prescribed several medications including montelukast, cetirizine, prednisone, Symbicort, albuterol which provided significant improvement of symptoms.  Unfortunately, he is without this medication and believes that the dust exposure at his employer has triggered his asthma/allergies again.  He is requesting refills of these medications.  He denies any known sick contacts.  Denies fever, nausea, vomiting, headache, dizziness, weakness.  He denies hospitalization related to asthma in the past.  Denies history of diabetes.    Past Medical History:  Diagnosis Date   Asthma     There are no problems to display for this patient.   History reviewed. No pertinent surgical history.     Home Medications    Prior to Admission medications   Medication Sig Start Date End Date Taking? Authorizing Provider  predniSONE (STERAPRED UNI-PAK 21 TAB) 10 MG (21) TBPK tablet As directed 04/28/22  Yes Elorah Dewing K, PA-C  albuterol (VENTOLIN HFA) 108 (90 Base) MCG/ACT inhaler Inhale 1-2 puffs into the lungs every 6 (six) hours as needed for wheezing or shortness of breath. 04/28/22   Dayona Shaheen, Noberto Retort, PA-C  budesonide-formoterol (SYMBICORT) 160-4.5 MCG/ACT inhaler Inhale 2 puffs into the lungs 2 (two) times daily. 04/28/22   Vernal Hritz, Noberto Retort, PA-C  cetirizine (ZYRTEC ALLERGY) 10 MG tablet Take 1 tablet (10 mg total) by mouth daily. 12/06/20   Particia Nearing, PA-C  montelukast (SINGULAIR) 10 MG tablet Take 1  tablet (10 mg total) by mouth at bedtime. 04/28/22   Amen Dargis, Noberto Retort, PA-C    Family History History reviewed. No pertinent family history.  Social History Social History   Tobacco Use   Smoking status: Never   Smokeless tobacco: Never  Vaping Use   Vaping Use: Never used  Substance Use Topics   Alcohol use: Not Currently   Drug use: Not Currently     Allergies   Patient has no known allergies.   Review of Systems Review of Systems  Constitutional:  Positive for activity change. Negative for appetite change, fatigue and fever.  HENT:  Positive for congestion. Negative for sinus pressure, sneezing and sore throat.   Respiratory:  Positive for cough and shortness of breath.   Cardiovascular:  Negative for chest pain.  Gastrointestinal:  Negative for abdominal pain, diarrhea, nausea and vomiting.  Neurological:  Negative for dizziness, light-headedness and headaches.     Physical Exam Triage Vital Signs ED Triage Vitals  Enc Vitals Group     BP 04/28/22 1452 120/78     Pulse Rate 04/28/22 1452 73     Resp 04/28/22 1452 18     Temp 04/28/22 1452 98.2 F (36.8 C)     Temp Source 04/28/22 1452 Oral     SpO2 04/28/22 1452 96 %     Weight --      Height --      Head Circumference --      Peak Flow --  Pain Score 04/28/22 1451 0     Pain Loc --      Pain Edu? --      Excl. in GC? --    No data found.  Updated Vital Signs BP 120/78 (BP Location: Left Arm)   Pulse 73   Temp 98.2 F (36.8 C) (Oral)   Resp 18   SpO2 96%   Visual Acuity Right Eye Distance:   Left Eye Distance:   Bilateral Distance:    Right Eye Near:   Left Eye Near:    Bilateral Near:     Physical Exam Vitals reviewed.  Constitutional:      General: He is awake.     Appearance: Normal appearance. He is well-developed. He is not ill-appearing.     Comments: Very pleasant male appears stated age in no acute distress sitting comfortably in exam room  HENT:     Head: Normocephalic  and atraumatic.     Right Ear: Tympanic membrane, ear canal and external ear normal. Tympanic membrane is not erythematous or bulging.     Left Ear: Tympanic membrane, ear canal and external ear normal. Tympanic membrane is not erythematous or bulging.     Nose: Nose normal.     Right Sinus: No maxillary sinus tenderness or frontal sinus tenderness.     Left Sinus: No maxillary sinus tenderness or frontal sinus tenderness.     Mouth/Throat:     Pharynx: Uvula midline. No oropharyngeal exudate or posterior oropharyngeal erythema.  Cardiovascular:     Rate and Rhythm: Normal rate and regular rhythm.     Heart sounds: Normal heart sounds, S1 normal and S2 normal. No murmur heard. Pulmonary:     Effort: Pulmonary effort is normal. No accessory muscle usage or respiratory distress.     Breath sounds: No stridor. Examination of the right-lower field reveals decreased breath sounds. Examination of the left-lower field reveals decreased breath sounds. Decreased breath sounds present. No wheezing, rhonchi or rales.  Neurological:     Mental Status: He is alert.  Psychiatric:        Behavior: Behavior is cooperative.      UC Treatments / Results  Labs (all labs ordered are listed, but only abnormal results are displayed) Labs Reviewed - No data to display  EKG   Radiology No results found.  Procedures Procedures (including critical care time)  Medications Ordered in UC Medications - No data to display  Initial Impression / Assessment and Plan / UC Course  I have reviewed the triage vital signs and the nursing notes.  Pertinent labs & imaging results that were available during my care of the patient were reviewed by me and considered in my medical decision making (see chart for details).     Patient is well-appearing, afebrile, nontoxic, nontachycardic.  Viral testing was deferred as he has been symptomatic for 4 to 5 days and this would not change her management by the time we  have results.  No evidence of acute infection on physical exam that would warrant initiation of antibiotics.  Concern for asthma exacerbation.  Patient was restarted on maintenance medication including montelukast and Symbicort.  Refill of albuterol sent to pharmacy.  Will start prednisone taper.  Discussed that he should not take NSAIDs with this medication due to risk of GI bleeding but can use over-the-counter medication including antihistamines, Flonase, Mucinex for symptom relief.  He is to rest and drink plenty of fluid.  Discussed that if his symptoms are not  improving within a few days of starting medication he should be reevaluated.  If anything worsens and he has shortness of breath, weakness, chest pain, fever he needs to be seen immediately.  Strict return precautions given to which she expressed understanding.  Final Clinical Impressions(s) / UC Diagnoses   Final diagnoses:  Mild intermittent asthma with acute exacerbation     Discharge Instructions      Use albuterol every 4-6 hours as needed.  Start prednisone taper as prescribed.  Do not take NSAIDs with this medication including aspirin, ibuprofen/Advil, naproxen/Aleve.  Use Symbicort twice daily.  Make sure to rinse your mouth following use of this medication to prevent thrush.  Take mildly Daily.  I recommend over-the-counter medications including Flonase and antihistamine such as cetirizine to help with your symptoms.  If your symptoms or not improving or if anything worsens you need to be seen immediately.     ED Prescriptions     Medication Sig Dispense Auth. Provider   albuterol (VENTOLIN HFA) 108 (90 Base) MCG/ACT inhaler Inhale 1-2 puffs into the lungs every 6 (six) hours as needed for wheezing or shortness of breath. 18 g Shamarra Warda K, PA-C   budesonide-formoterol (SYMBICORT) 160-4.5 MCG/ACT inhaler Inhale 2 puffs into the lungs 2 (two) times daily. 1 each Gavinn Collard K, PA-C   montelukast (SINGULAIR) 10 MG tablet  Take 1 tablet (10 mg total) by mouth at bedtime. 30 tablet Amjad Fikes K, PA-C   predniSONE (STERAPRED UNI-PAK 21 TAB) 10 MG (21) TBPK tablet As directed 21 tablet Vertis Scheib K, PA-C      PDMP not reviewed this encounter.   Jeani Hawking, PA-C 04/28/22 1505

## 2022-05-13 ENCOUNTER — Ambulatory Visit: Payer: Managed Care, Other (non HMO)

## 2022-05-13 ENCOUNTER — Encounter: Payer: Self-pay | Admitting: Emergency Medicine

## 2022-05-13 ENCOUNTER — Ambulatory Visit
Admission: EM | Admit: 2022-05-13 | Discharge: 2022-05-13 | Disposition: A | Discharge status: Home / Self Care | Payer: Managed Care, Other (non HMO) | Attending: Internal Medicine | Admitting: Internal Medicine

## 2022-05-13 DIAGNOSIS — J101 Influenza due to other identified influenza virus with other respiratory manifestations: Secondary | ICD-10-CM

## 2022-05-13 DIAGNOSIS — R053 Chronic cough: Secondary | ICD-10-CM

## 2022-05-13 DIAGNOSIS — R059 Cough, unspecified: Secondary | ICD-10-CM | POA: Diagnosis not present

## 2022-05-13 DIAGNOSIS — J069 Acute upper respiratory infection, unspecified: Secondary | ICD-10-CM | POA: Diagnosis not present

## 2022-05-13 LAB — POCT INFLUENZA A/B
Influenza A, POC: POSITIVE — AB
Influenza B, POC: NEGATIVE

## 2022-05-13 MED ORDER — AMOXICILLIN-POT CLAVULANATE 875-125 MG PO TABS: 1.0000 | ORAL_TABLET | Freq: Two times a day (BID) | ORAL | 0 refills | Status: DC

## 2022-05-13 MED ORDER — OSELTAMIVIR PHOSPHATE 75 MG PO CAPS: 75.0000 mg | ORAL_CAPSULE | Freq: Two times a day (BID) | ORAL | 0 refills | Status: DC

## 2022-05-13 NOTE — Discharge Instructions (Signed)
You tested positive for influenza.  I am treating this with Tamiflu.  You also have a persistent upper respiratory infection which is being treated with Augmentin antibiotic.  Please take these medications with food.  Follow-up if symptoms persist or worsen.  Ensure that you are drinking adequate fluids and getting adequate rest.

## 2022-05-13 NOTE — ED Provider Notes (Signed)
[facility]    CSN: [id number] Arrival date & time: 05/13/22  1111      History   Chief Complaint Chief Complaint  Patient presents with   URI    HPI William Brandt is a 42 y.o. male.   Patient presents with nasal congestion, nasal drainage, nonproductive cough, body aches, chills, nausea, vomiting.  Patient reports nasal congestion and cough that has been present for a few weeks.  He was seen on 04/28/2022 for the symptoms and prescribed prednisone steroid with minimal improvement.  He reports that he developed body aches, chills, fever yesterday.  He is not sure of Tmax at home.  Denies any new known sick contacts.  Denies chest pain, shortness of breath, blood in emesis, diarrhea, abdominal pain.  He has not taken any over-the-counter cold and flu medications.  Reports history of asthma.   URI   Past Medical History:  Diagnosis Date   Asthma     There are no problems to display for this patient.   History reviewed. No pertinent surgical history.     Home Medications    Prior to Admission medications   Medication Sig Start Date End Date Taking? Authorizing Provider  albuterol (VENTOLIN HFA) 108 (90 Base) MCG/ACT inhaler Inhale 1-2 puffs into the lungs every 6 (six) hours as needed for wheezing or shortness of breath. 04/28/22  Yes Raspet, Erin K, PA-C  amoxicillin-clavulanate (AUGMENTIN) 875-125 MG tablet Take 1 tablet by mouth every 12 (twelve) hours. 05/13/22  Yes Breslin Hemann, Acie Fredrickson, FNP  budesonide-formoterol (SYMBICORT) 160-4.5 MCG/ACT inhaler Inhale 2 puffs into the lungs 2 (two) times daily. 04/28/22  Yes Raspet, Erin K, PA-C  montelukast (SINGULAIR) 10 MG tablet Take 1 tablet (10 mg total) by mouth at bedtime. 04/28/22  Yes Raspet, Noberto Retort, PA-C  oseltamivir (TAMIFLU) 75 MG capsule Take 1 capsule (75 mg total) by mouth every 12 (twelve) hours. 05/13/22  Yes Teller Wakefield, Acie Fredrickson, FNP  cetirizine (ZYRTEC ALLERGY) 10 MG tablet Take 1 tablet (10 mg total) by  mouth daily. 12/06/20   Particia Nearing, PA-C  predniSONE (STERAPRED UNI-PAK 21 TAB) 10 MG (21) TBPK tablet As directed 04/28/22   Raspet, Noberto Retort, PA-C    Family History History reviewed. No pertinent family history.  Social History Social History   Tobacco Use   Smoking status: Never   Smokeless tobacco: Never  Vaping Use   Vaping Use: Never used  Substance Use Topics   Alcohol use: Not Currently   Drug use: Not Currently     Allergies   Patient has no known allergies.   Review of Systems Review of Systems Per HPI  Physical Exam Triage Vital Signs ED Triage Vitals  Enc Vitals Group     BP 05/13/22 1158 (!) 144/93     Pulse Rate 05/13/22 1158 100     Resp 05/13/22 1158 20     Temp 05/13/22 1158 100 F (37.8 C)     Temp Source 05/13/22 1158 Oral     SpO2 05/13/22 1158 93 %     Weight 05/13/22 1159 240 lb (108.9 kg)     Height 05/13/22 1159 5\' 7"  (1.702 m)     Head Circumference --      Peak Flow --      Pain Score 05/13/22 1159 0     Pain Loc --      Pain Edu? --      Excl. in GC? --    No data found.  Updated Vital Signs BP (!) 144/93 (BP Location: Left Arm)   Pulse 100   Temp 100 F (37.8 C) (Oral)   Resp 20   Ht 5\' 7"  (1.702 m)   Wt 240 lb (108.9 kg)   SpO2 95%   BMI 37.59 kg/m   Visual Acuity Right Eye Distance:   Left Eye Distance:   Bilateral Distance:    Right Eye Near:   Left Eye Near:    Bilateral Near:     Physical Exam Constitutional:      General: He is not in acute distress.    Appearance: Normal appearance. He is not toxic-appearing or diaphoretic.  HENT:     Head: Normocephalic and atraumatic.     Right Ear: Tympanic membrane and ear canal normal.     Left Ear: Tympanic membrane and ear canal normal.     Nose: Congestion present.     Mouth/Throat:     Mouth: Mucous membranes are moist.     Pharynx: No posterior oropharyngeal erythema.  Eyes:     Extraocular Movements: Extraocular movements intact.      Conjunctiva/sclera: Conjunctivae normal.     Pupils: Pupils are equal, round, and reactive to light.  Cardiovascular:     Rate and Rhythm: Normal rate and regular rhythm.     Pulses: Normal pulses.     Heart sounds: Normal heart sounds.  Pulmonary:     Effort: Pulmonary effort is normal. No respiratory distress.     Breath sounds: Normal breath sounds. No stridor. No wheezing, rhonchi or rales.  Abdominal:     General: Abdomen is flat. Bowel sounds are normal.     Palpations: Abdomen is soft.  Musculoskeletal:        General: Normal range of motion.     Cervical back: Normal range of motion.  Skin:    General: Skin is warm and dry.  Neurological:     General: No focal deficit present.     Mental Status: He is alert and oriented to person, place, and time. Mental status is at baseline.  Psychiatric:        Mood and Affect: Mood normal.        Behavior: Behavior normal.      UC Treatments / Results  Labs (all labs ordered are listed, but only abnormal results are displayed) Labs Reviewed  POCT INFLUENZA A/B - Abnormal; Notable for the following components:      Result Value   Influenza A, POC Positive (*)    All other components within normal limits    EKG   Radiology DG Chest 2 View  Result Date: 05/13/2022 CLINICAL DATA:  Cough, URI symptoms EXAM: CHEST - 2 VIEW COMPARISON:  08/05/2020 FINDINGS: The heart size and mediastinal contours are within normal limits. Both lungs are clear. The visualized skeletal structures are unremarkable except for old healed right rib fractures noted. IMPRESSION: No active cardiopulmonary disease. Electronically Signed   By: 10/05/2020.  Shick M.D.   On: 05/13/2022 12:31    Procedures Procedures (including critical care time)  Medications Ordered in UC Medications - No data to display  Initial Impression / Assessment and Plan / UC Course  I have reviewed the triage vital signs and the nursing notes.  Pertinent labs & imaging results that were  available during my care of the patient were reviewed by me and considered in my medical decision making (see chart for details).     Patient presents with persistent nasal congestion and  nonproductive cough that has been present for 2 to 3 weeks with new onset fever, body aches, nausea, vomiting that started yesterday.  Given new onset fever and persistent symptoms, chest x-ray was completed to check for community-acquired pneumonia which was unremarkable.  I was suspicious of new onset secondary viral illness given new onset of fever, body aches, nausea, vomiting that started yesterday.  Therefore, rapid flu test was completed that was positive for influenza A.  Given these new symptoms started yesterday, will opt to treat with Tamiflu.  I am also concerned for secondary bacterial upper respiratory infection given persistent nasal congestion.  Therefore, will treat with Augmentin antibiotic.  Patient was advised of supportive care and symptom management including adequate fluids and rest as well.  There are no adventitious lung sounds on exam, no tachypnea, and oxygen is normal on recheck so do not think that emergent evaluation is necessary.  Patient was given strict return and ER precautions.  Patient verbalized understanding and was agreeable with plan. Final Clinical Impressions(s) / UC Diagnoses   Final diagnoses:  Influenza A  Acute upper respiratory infection  Persistent cough     Discharge Instructions      You tested positive for influenza.  I am treating this with Tamiflu.  You also have a persistent upper respiratory infection which is being treated with Augmentin antibiotic.  Please take these medications with food.  Follow-up if symptoms persist or worsen.  Ensure that you are drinking adequate fluids and getting adequate rest.     ED Prescriptions     Medication Sig Dispense Auth. Provider   oseltamivir (TAMIFLU) 75 MG capsule Take 1 capsule (75 mg total) by mouth every 12  (twelve) hours. 10 capsule Oswaldo Conroy E, [place]   amoxicillin-clavulanate (AUGMENTIN) 875-125 MG tablet Take 1 tablet by mouth every 12 (twelve) hours. 14 tablet [place], Michele Rockers, [place]      PDMP not reviewed this encounter.   Teodora Medici,  05/13/22 1350

## 2022-05-13 NOTE — ED Triage Notes (Signed)
Patient states that he was seen on 04/28/2022 w/URI.  Yesterday he started vomiting, bodyaches, chills, headache, nasal drainage.  Patient denies any OTC cold meds.

## 2022-07-22 NOTE — Progress Notes (Signed)
Subjective:    William Brandt - 42 y.o. male MRN [id number]  Date of birth: 1980-05-08  HPI  William Brandt is to establish care.   Current issues and/or concerns: - Nonproductive cough and nasal congestion for several days. He denies red flag symptoms. Using over-the-counter medications with some relief. States he thinks symptoms related to his job where he works at [employer].  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 Causes discomfort with certain movements of neck. Denies additional symptoms.    ROS per HPI    Health Maintenance: Health Maintenance Due  Topic Date Due   COVID-19 Vaccine (1) Never done   HIV Screening  Never done   Hepatitis C Screening  Never done     Past Medical History: There are no problems to display for this patient.     Social History   reports that he has been smoking cigarettes. He has been exposed to tobacco smoke. He has never used smokeless tobacco. He reports current alcohol use. He reports current drug use. Drug: Marijuana.   Family History  family history is not on file.   Medications: reviewed and updated   Objective:   Physical Exam BP 106/70 (BP Location: Left Arm, Patient Position: Sitting, Cuff Size: Large)   Pulse 76   Temp 98.3 F (36.8 C)   Resp 16   Ht 5' 7.32" (1.71 m)   Wt 256 lb (116.1 kg)   SpO2 95%   BMI 39.71 kg/m   Physical Exam HENT:     Head: Normocephalic and atraumatic.  Eyes:     Extraocular Movements: Extraocular movements intact.     Conjunctiva/sclera: Conjunctivae normal.     Pupils: Pupils are equal, round, and reactive to light.  Neck:     Comments: Lipoma soft and movable posterior neck and no additional presentation. Cardiovascular:     Rate and Rhythm: Normal rate and regular rhythm.     Pulses: Normal pulses.     Heart sounds: Normal heart sounds.  Pulmonary:     Effort: Pulmonary effort is normal.     Breath sounds: Normal breath sounds.   Musculoskeletal:     Cervical back: Normal range of motion and neck supple.  Skin:    General: Skin is warm and dry.  Neurological:     General: No focal deficit present.     Mental Status: He is alert and oriented to Brandt, place, and time.  Psychiatric:        Mood and Affect: Mood normal.        Behavior: Behavior normal.         Assessment & Plan:  1. Encounter to establish care - Patient presents today to establish care. During the interim follow-up with primary provider as scheduled.  - Return for annual physical examination, labs, and health maintenance. Arrive fasting meaning having no food for at least 8 hours prior to appointment. You may have only water or black coffee. Please take scheduled medications as normal.  2. Flu-like symptoms - Patient today in office with no cardiopulmonary distress.  - Routine screening.  - Benzonatate capsules as prescribed. Counseled on medication adherence.  - COVID-19, Flu A+B and RSV - benzonatate (TESSALON PERLES) 100 MG capsule; Take 1 capsule (100 mg total) by mouth 3 (three) times daily as needed for cough.  Dispense: 30 capsule; Refill: 0  3. Lipoma of neck - Referral to Dermatology for further evaluation/management.  - Ambulatory referral to Dermatology  Patient was given clear instructions to go to Emergency Department or return to medical center if symptoms don't improve, worsen, or new problems develop.The patient verbalized understanding.  I discussed the assessment and treatment plan with the patient. The patient was provided an opportunity to ask questions and all were answered. The patient agreed with the plan and demonstrated an understanding of the instructions.   The patient was advised to call back or seek an in-Brandt evaluation if the symptoms worsen or if the condition fails to improve as anticipated.    Durene Fruits, NP 07/26/2022, 9:50 AM Primary Care at [facility]

## 2022-07-26 ENCOUNTER — Encounter: Payer: Self-pay | Admitting: Family

## 2022-07-26 ENCOUNTER — Ambulatory Visit (INDEPENDENT_AMBULATORY_CARE_PROVIDER_SITE_OTHER): Payer: Managed Care, Other (non HMO) | Admitting: Family

## 2022-07-26 VITALS — BP 106/70 | HR 76 | Temp 98.3°F | Resp 16 | Ht 67.32 in | Wt 256.0 lb

## 2022-07-26 DIAGNOSIS — R6889 Other general symptoms and signs: Secondary | ICD-10-CM

## 2022-07-26 DIAGNOSIS — D170 Benign lipomatous neoplasm of skin and subcutaneous tissue of head, face and neck: Secondary | ICD-10-CM

## 2022-07-26 DIAGNOSIS — Z7689 Persons encountering health services in other specified circumstances: Secondary | ICD-10-CM | POA: Diagnosis not present

## 2022-07-26 MED ORDER — BENZONATATE 100 MG PO CAPS
100.0000 mg | ORAL_CAPSULE | Freq: Three times a day (TID) | ORAL | 0 refills | Status: AC | PRN
Start: 2022-07-26 — End: ?

## 2022-07-26 NOTE — Patient Instructions (Signed)
Thank you for choosing Primary Care at [facility] for your medical home!    William Brandt was seen by Camillia Herter, NP today.   Harrell Gave Wilbert's primary care provider is Durene Fruits, NP.   For the best care possible,  you should try to see Durene Fruits, NP whenever you come to office.   We look forward to seeing you again soon!  If you have any questions about your visit today,  please call us at [phone]  Or feel free to reach your provider via [place].   Keeping you healthy   Get these tests Blood pressure- Have your blood pressure checked once a year by your healthcare provider.  Normal blood pressure is 120/80. Weight- Have your body mass index (BMI) calculated to screen for obesity.  BMI is a measure of body fat based on height and weight. You can also calculate your own BMI at [employer]velBags.it. Cholesterol- Have your cholesterol checked regularly starting at age 66, sooner may be necessary if you have diabetes, high blood pressure, if a family member developed heart diseases at an early age or if you smoke.  Chlamydia, HIV, and other sexual transmitted disease- Get screened each year until the age of 89 then within three months of each new sexual partner. Diabetes- Have your blood sugar checked regularly if you have high blood pressure, high cholesterol, a family history of diabetes or if you are overweight.   Get these vaccines Flu shot- Every fall. Tetanus shot- Every 10 years. Menactra- Single dose; prevents meningitis.   Take these steps Don't smoke- If you do smoke, ask your healthcare provider about quitting. For tips on how to quit, go to www.smokefree.gov or call 1-800-QUIT-NOW. Be physically active- Exercise 5 days a week for at least 30 minutes.  If you are not already physically active start slow and gradually work up to 30 minutes of moderate physical activity.  Examples of moderate activity include walking briskly, mowing the yard,  dancing, swimming bicycling, etc. Eat a healthy diet- Eat a variety of healthy foods such as fruits, vegetables, low fat milk, low fat cheese, yogurt, lean meats, poultry, fish, beans, tofu, etc.  For more information on healthy eating, go to www.thenutritionsource.org Drink alcohol in moderation- Limit alcohol intake two drinks or less a day.  Never drink and drive. Dentist- Brush and floss teeth twice daily; visit your dentis twice a year. Depression-Your emotional health is as important as your physical health.  If you're feeling down, losing interest in things you normally enjoy please talk with your healthcare provider. Gun Safety- If you keep a gun in your home, keep it unloaded and with the safety lock on.  Bullets should be stored separately. Helmet use- Always wear a helmet when riding a motorcycle, bicycle, rollerblading or skateboarding. Safe sex- If you may be exposed to a sexually transmitted infection, use a condom Seat belts- Seat bels can save your life; always wear one. Smoke/Carbon Monoxide detectors- These detectors need to be installed on the appropriate level of your home.  Replace batteries at least once a year. Skin Cancer- When out in the sun, cover up and use sunscreen SPF 15 or higher. Violence- If anyone is threatening or hurting you, please tell your healthcare provider.

## 2022-07-26 NOTE — Progress Notes (Signed)
.  Pt presents to establish care,  -pt been having flu-like symptoms

## 2022-07-27 LAB — COVID-19, FLU A+B AND RSV
Influenza A, NAA: NOT DETECTED
Influenza B, NAA: NOT DETECTED
RSV, NAA: NOT DETECTED
SARS-CoV-2, NAA: NOT DETECTED

## 2022-08-05 NOTE — Progress Notes (Unsigned)
Patient ID: William Brandt, male    DOB: 1981/02/19  MRN: [id number]  CC: Paperwork   Subjective: William Brandt is a 42 y.o. male who presents for paperwork.   His concerns today include:  Patient presents to have work accommodations paperwork completed to return to his employer [employer]. States he thinks he has sensitivity to dust at his job. He denies associated red flag symptoms.    There are no problems to display for this patient.    Current Outpatient Medications on File Prior to Visit  Medication Sig Dispense Refill   albuterol (VENTOLIN HFA) 108 (90 Base) MCG/ACT inhaler Inhale 1-2 puffs into the lungs every 6 (six) hours as needed for wheezing or shortness of breath. 18 g 2   benzonatate (TESSALON PERLES) 100 MG capsule Take 1 capsule (100 mg total) by mouth 3 (three) times daily as needed for cough. 30 capsule 0   budesonide-formoterol (SYMBICORT) 160-4.5 MCG/ACT inhaler Inhale 2 puffs into the lungs 2 (two) times daily. 1 each 2   cetirizine (ZYRTEC ALLERGY) 10 MG tablet Take 1 tablet (10 mg total) by mouth daily. 30 tablet 2   montelukast (SINGULAIR) 10 MG tablet Take 1 tablet (10 mg total) by mouth at bedtime. 30 tablet 2   No current facility-administered medications on file prior to visit.    Allergies  Allergen Reactions   Shellfish Allergy Anaphylaxis    Social History   Socioeconomic History   Marital status: Single    Spouse name: Not on file   Number of children: Not on file   Years of education: Not on file   Highest education level: Not on file  Occupational History   Not on file  Tobacco Use   Smoking status: Every Day    Types: Cigarettes    Passive exposure: Current   Smokeless tobacco: Never  Vaping Use   Vaping Use: Never used  Substance and Sexual Activity   Alcohol use: Yes    Comment: occassionally   Drug use: Yes    Types: Marijuana   Sexual activity: Not on file  Other Topics Concern   Not on  file  Social History Narrative   Not on file   Social Determinants of Health   Financial Resource Strain: Not on file  Food Insecurity: Not on file  Transportation Needs: Not on file  Physical Activity: Not on file  Stress: Not on file  Social Connections: Not on file  Intimate Partner Violence: Not on file    No family history on file.  No past surgical history on file.  ROS: Review of Systems Negative except as stated above  PHYSICAL EXAM: BP 118/78 (BP Location: Left Arm, Patient Position: Sitting, Cuff Size: Normal)   Pulse 87   Temp 98.5 F (36.9 C)   Resp 14   Ht 5\' 7"  (1.702 m)   Wt 253 lb 6.4 oz (114.9 kg)   SpO2 96%   BMI 39.69 kg/m   Physical Exam HENT:     Head: Normocephalic and atraumatic.  Eyes:     Extraocular Movements: Extraocular movements intact.     Conjunctiva/sclera: Conjunctivae normal.     Pupils: Pupils are equal, round, and reactive to light.  Cardiovascular:     Rate and Rhythm: Normal rate and regular rhythm.     Pulses: Normal pulses.     Heart sounds: Normal heart sounds.  Pulmonary:     Effort: Pulmonary effort is normal.     Breath  sounds: Normal breath sounds.  Musculoskeletal:     Cervical back: Normal range of motion and neck supple.  Neurological:     General: No focal deficit present.     Mental Status: He is alert and oriented to person, place, and time.  Psychiatric:        Mood and Affect: Mood normal.        Behavior: Behavior normal.      ASSESSMENT AND PLAN: 1. Dust allergy - I discussed with patient in detail I am unable to complete requested work accommodations paperwork on today due to our office policy that he has been a patient for less than 90 days. Patient verbalized understanding.  - Referral to Allergy for further evaluation/management.  - Ambulatory referral to Allergy  Patient was given the opportunity to ask questions.  Patient verbalized understanding of the plan and was able to repeat key  elements of the plan. Patient was given clear instructions to go to Emergency Department or return to medical center if symptoms don't improve, worsen, or new problems develop.The patient verbalized understanding.   Orders Placed This Encounter  Procedures   Ambulatory referral to Allergy   Follow-up with primary provider as scheduled.   Camillia Herter, NP

## 2022-08-06 ENCOUNTER — Ambulatory Visit (INDEPENDENT_AMBULATORY_CARE_PROVIDER_SITE_OTHER): Payer: Managed Care, Other (non HMO) | Admitting: Family

## 2022-08-06 ENCOUNTER — Encounter: Payer: Self-pay | Admitting: Family

## 2022-08-06 VITALS — BP 118/78 | HR 87 | Temp 98.5°F | Resp 14 | Ht 67.0 in | Wt 253.4 lb

## 2022-08-06 DIAGNOSIS — Z0289 Encounter for other administrative examinations: Secondary | ICD-10-CM

## 2022-08-06 DIAGNOSIS — J3089 Other allergic rhinitis: Secondary | ICD-10-CM

## 2022-08-06 NOTE — Progress Notes (Signed)
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

## 2022-08-08 ENCOUNTER — Ambulatory Visit (INDEPENDENT_AMBULATORY_CARE_PROVIDER_SITE_OTHER): Payer: Managed Care, Other (non HMO) | Admitting: Allergy & Immunology

## 2022-08-08 ENCOUNTER — Other Ambulatory Visit: Payer: Self-pay

## 2022-08-08 ENCOUNTER — Encounter: Payer: Self-pay | Admitting: Allergy & Immunology

## 2022-08-08 VITALS — BP 122/70 | HR 90 | Temp 98.3°F | Resp 20 | Ht 67.0 in | Wt 260.0 lb

## 2022-08-08 DIAGNOSIS — J310 Chronic rhinitis: Secondary | ICD-10-CM

## 2022-08-08 DIAGNOSIS — F17200 Nicotine dependence, unspecified, uncomplicated: Secondary | ICD-10-CM | POA: Diagnosis not present

## 2022-08-08 DIAGNOSIS — J302 Other seasonal allergic rhinitis: Secondary | ICD-10-CM | POA: Insufficient documentation

## 2022-08-08 DIAGNOSIS — J3089 Other allergic rhinitis: Secondary | ICD-10-CM

## 2022-08-08 DIAGNOSIS — J4540 Moderate persistent asthma, uncomplicated: Secondary | ICD-10-CM | POA: Insufficient documentation

## 2022-08-08 MED ORDER — LEVOCETIRIZINE DIHYDROCHLORIDE 5 MG PO TABS: 5.0000 mg | ORAL_TABLET | Freq: Every evening | ORAL | 5 refills | Status: DC

## 2022-08-08 MED ORDER — TRELEGY ELLIPTA 200-62.5-25 MCG/ACT IN AEPB: 1.0000 | INHALATION_SPRAY | Freq: Every day | RESPIRATORY_TRACT | 5 refills | Status: DC

## 2022-08-08 MED ORDER — MONTELUKAST SODIUM 10 MG PO TABS: 10.0000 mg | ORAL_TABLET | Freq: Every day | ORAL | 5 refills | Status: DC

## 2022-08-08 MED ORDER — FLUTICASONE PROPIONATE 50 MCG/ACT NA SUSP: 2.0000 | Freq: Every day | NASAL | 5 refills | Status: DC

## 2022-08-08 NOTE — Progress Notes (Signed)
NEW PATIENT  Date of Service/Encounter:  08/08/22  Consult requested by: Pcp, No   Assessment:   Moderate persistent asthma, uncomplicated  Current smoker  Seasonal and perennial allergic rhinitis  Plan/Recommendations:   1. Moderate persistent asthma, uncomplicated - Lung testing was in the mid 60% range, but did improve slightly with the albuterol nebulizer treatment. - We are going to get some labs to rule out weird causes of difficult to control asthma, including those that might be caused by her workplace. - We we will call you in 1 to 2 weeks for the results of the testing. - In the meantime, we are going to change you to Trelegy instead of Symbicort (this is a once daily medication containing three different medications to help keep inflammation under control).  - Sample and copay card provided today.  - We might start an injectable medication to help the Trelegy work better, depending on the labs and how you do between now and the next visit.  - Spacer sample and demonstration provided. - Daily controller medication(s): Singulair 10mg  daily and Trelegy 200/62.5/25 one puff once daily - Prior to physical activity: albuterol 2 puffs 10-15 minutes before physical activity. - Rescue medications: albuterol 2 puffs every 4-6 hours as needed - Asthma control goals:  * Full participation in all desired activities (may need albuterol before activity) * Albuterol use two time or less a week on average (not counting use with activity) * Cough interfering with sleep two time or less a month * Oral steroids no more than once a year * No hospitalizations  2. Seasonal and perennial allergic rhinitis - Testing today showed: ragweed, weeds, trees, indoor molds, outdoor molds, dust mites, cat, dog, and cockroach. - Copy of test results provided.  - Avoidance measures provided. - Continue with: Singulair (montelukast) 10mg  daily - Start taking: Xyzal (levocetirizine) 5mg  tablet once  daily and Flonase (fluticasone) one spray per nostril daily (AIM FOR EAR ON EACH SIDE) - You can use an extra dose of the antihistamine, if needed, for breakthrough symptoms.  - Consider nasal saline rinses 1-2 times daily to remove allergens from the nasal cavities as well as help with mucous clearance (this is especially helpful to do before the nasal sprays are given) - Strongly consider allergy shots as a means of long-term control. - Allergy shots "re-train" and "reset" the immune system to ignore environmental allergens and decrease the resulting immune response to those allergens (sneezing, itchy watery eyes, runny nose, nasal congestion, etc).    - Allergy shots improve symptoms in 75-85% of patients.  - We can discuss more at the next appointment if the medications are not working for you.  3. Return in about 4 weeks (around 09/05/2022). You can have the follow up appointment with Dr. Ernst Bowler or a Nurse Practicioner (our Nurse Practitioners are excellent and always have Physician oversight!).    This note in its entirety was forwarded to the Provider who requested this consultation.  Subjective:   William Brandt is a 42 y.o. male presenting today for evaluation of  Chief Complaint  Patient presents with   Other    Pt states he has dust allergies gets worst at his work cause he works in lots of [place] has a history of the following: Patient Active Problem List   Diagnosis Date Noted   Moderate persistent asthma, uncomplicated [id number]   Seasonal and perennial allergic rhinitis 08/08/2022   Current smoker 08/08/2022  History obtained from: chart review and patient.  Audley Rosel was referred by Pcp, No.     William Brandt is a 42 y.o. male presenting for an evaluation of shortness of breath and allergies .    Asthma/Respiratory Symptom History: He has Symbicort 160/4.5 two puffs BID, with an unclear time course. He tells me that he  had a terrible URI and was coughing a lot. He ended up pulling his muscles in his chest. He was on topical lidocaine for that prior to starting his current job.  He is taking the montelukast daily and he might have started this in 2023 sometime during the terrible URI. He has been on prednisone.   Allergic Rhinitis Symptom History: He works in an Teaching laboratory technician. He has had allergy problems in the past with pollens and whatnot. But this is different times of the year. But since he has been working there, he has been articularly bad. Symptoms have been terrible for the last 6 months or so. He reports that he been using the  He has been having worsening sinus issues.  He has never been allergy tested in the past.  He has never been on allergy shots.  He thinks he is use no sprays, but he does not really go to using anything consistently.  He does report a lot of exposures in his workplace.  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  Otherwise, there is no history of other atopic diseases, including food allergies, drug allergies, stinging insect allergies, eczema, urticaria, or contact dermatitis. There is no significant infectious history. Vaccinations are up to date.    Past Medical History: Patient Active Problem List   Diagnosis Date Noted   Moderate persistent asthma, uncomplicated [id number]   Seasonal and perennial allergic rhinitis 08/08/2022   Current smoker 08/08/2022    Medication List:  Allergies as of 08/08/2022       Reactions   Shellfish Allergy Anaphylaxis        Medication List        Accurate as of August 08, 2022 11:51 AM. If you have any questions, ask your nurse or doctor.          albuterol 108 (90 Base) MCG/ACT inhaler Commonly known as: VENTOLIN HFA Inhale 1-2 puffs into the lungs every 6 (six) hours as needed for wheezing or shortness of breath.   benzonatate 100 MG capsule Commonly known as: Tessalon Perles Take 1 capsule (100 mg  total) by mouth 3 (three) times daily as needed for cough.   budesonide-formoterol 160-4.5 MCG/ACT inhaler Commonly known as: SYMBICORT Inhale 2 puffs into the lungs 2 (two) times daily.   cetirizine 10 MG tablet Commonly known as: ZyrTEC Allergy Take 1 tablet (10 mg total) by mouth daily.   montelukast 10 MG tablet Commonly known as: Singulair Take 1 tablet (10 mg total) by mouth at bedtime.        Birth History: non-contributory  Developmental History: non-contributory  Past Surgical History: History reviewed. No pertinent surgical history.   Family History: History reviewed. No pertinent family history.   Social History: Hanif lives at home with his family. He has lived in this area for 7 years since moving down here from [place], [place]. He also spent some time living in [place].  He lives in an apartment.  There is carpeting throughout his home.  There is electric heating and central cooling.  There are no dust mite covers on the bedding.  There is no tobacco exposure, although he mentions that he picked up smoking again.  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  He has been there for 6 months.  There is no HEPA filter in the home.  He is exposed to fumes and chemicals as well as dust in his workplace.  He does not live near an interstate or industrial area. He was working at [employer] in the [employer]. He was not having the SOB and difficulty breathing when he was working at [employer].    Review of Systems  Constitutional: Negative.  Negative for chills, fever, malaise/fatigue and weight loss.  HENT:  Positive for congestion. Negative for ear discharge, ear pain and sinus pain.   Eyes:  Negative for pain, discharge and redness.  Respiratory:  Positive for cough, sputum production, shortness of breath and wheezing.   Cardiovascular: Negative.  Negative for chest pain and palpitations.  Gastrointestinal:  Negative for  abdominal pain, constipation, diarrhea, heartburn, nausea and vomiting.  Skin: Negative.  Negative for itching and rash.  Neurological:  Negative for dizziness and headaches.  Endo/Heme/Allergies:  Positive for environmental allergies. Does not bruise/bleed easily.       Objective:   Blood pressure 122/70, pulse 90, temperature 98.3 F (36.8 C), resp. rate 20, height 5\' 7"  (1.702 m), weight 260 lb (117.9 kg), SpO2 98 %. Body mass index is 40.72 kg/m.     Physical Exam Vitals reviewed.  Constitutional:      Appearance: He is well-developed.  HENT:     Head: Normocephalic and atraumatic.     Right Ear: Tympanic membrane, ear canal and external ear normal. No drainage, swelling or tenderness. Tympanic membrane is not injected, scarred, erythematous, retracted or bulging.     Left Ear: Tympanic membrane, ear canal and external ear normal. No drainage, swelling or tenderness. Tympanic membrane is not injected, scarred, erythematous, retracted or bulging.     Nose: Rhinorrhea present. No nasal deformity, septal deviation or mucosal edema.     Right Turbinates: Enlarged, swollen and pale.     Left Turbinates: Enlarged, swollen and pale.     Right Sinus: No maxillary sinus tenderness or frontal sinus tenderness.     Left Sinus: No maxillary sinus tenderness or frontal sinus tenderness.     Mouth/Throat:     Mouth: Mucous membranes are not pale and not dry.     Pharynx: Uvula midline.  Eyes:     General:        Right eye: No discharge.        Left eye: No discharge.     Conjunctiva/sclera: Conjunctivae normal.     Right eye: Right conjunctiva is not injected. No chemosis.    Left eye: Left conjunctiva is not injected. No chemosis.    Pupils: Pupils are equal, round, and reactive to light.  Cardiovascular:     Rate and Rhythm: Normal rate and regular rhythm.     Heart sounds: Normal heart sounds.  Pulmonary:     Effort: Pulmonary effort is normal. No tachypnea, accessory muscle  usage or respiratory distress.     Breath sounds: Transmitted upper airway sounds present. No decreased air movement. No wheezing, rhonchi or rales.     Comments: Coarse upper airway sounds throughout. Chest:     Chest wall: No tenderness.  Abdominal:     Tenderness: There is no abdominal tenderness. There is no guarding or rebound.  Lymphadenopathy:  Head:     Right side of head: No submandibular, tonsillar or occipital adenopathy.     Left side of head: No submandibular, tonsillar or occipital adenopathy.     Cervical: No cervical adenopathy.  Skin:    Coloration: Skin is not pale.     Findings: No abrasion, erythema, petechiae or rash. Rash is not papular, urticarial or vesicular.  Neurological:     Mental Status: He is alert.  Psychiatric:        Behavior: Behavior is cooperative.      Diagnostic studies:    Spirometry: results abnormal (FEV1: 2.21/64%, FVC: 2.77/65%, FEV1/FVC: 80%).    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 Albuterol/Atrovent nebulizer treatment given in clinic with improvement in FEV1 and FVC, but not significant per ATS criteria.  Allergy Studies:    Airborne Adult Perc - 08/08/22 0947     Time Antigen Placed 0947    Allergen Manufacturer Lavella Hammock    Location Back    Number of Test 57    1. Control-Buffer 50% Glycerol Negative    2. Control-Histamine 1 mg/ml 2+    3. Albumin saline Negative    4. Peyton Negative    5. [place] Negative    6. Johnson Negative    7. [place] Blue Negative    8. [place] Omitted    9. Perennial Rye Omitted    10. Sweet Vernal Negative    11. Timothy Negative    12. Cocklebur Negative    13. Burweed Marshelder Negative    14. Ragweed, short 4+    15. Ragweed, Giant Negative    16. Plantain,  English Negative    17. Lamb's Quarters Negative    18. Sheep Sorrell Negative    19. Rough Pigweed Negative    20. Marsh Elder, Rough Negative    21. Mugwort, Common Negative    22. Ash mix  Negative    23. Birch mix Negative    24. Beech American 3+    25. Box, Elder Negative    26. Cedar, red Negative    27. Cottonwood, [place] Negative    28. Elm mix Negative    29. Hickory Negative    30. Maple mix Negative    31. Oak, [place] mix Negative    32. Pecan Pollen Negative    33. Pine mix Negative    34. Sycamore Eastern Negative    35. [place], Black Pollen Negative    36. Alternaria alternata Negative    37. Cladosporium Herbarum Negative    38. Aspergillus mix Negative    39. Penicillium mix Negative    40. Bipolaris sorokiniana (Helminthosporium) Negative    41. Drechslera spicifera (Curvularia) Negative    42. Mucor plumbeus Negative    43. Fusarium moniliforme Negative    44. Aureobasidium pullulans (pullulara) Negative    45. Rhizopus oryzae Negative    46. Botrytis cinera Negative    47. Epicoccum nigrum Negative    48. Phoma betae Negative    49. Candida Albicans Negative    50. Trichophyton mentagrophytes Negative    51. Mite, D Farinae  5,000 AU/ml Negative    52. Mite, D Pteronyssinus  5,000 AU/ml Negative    53. Cat Hair 10,000 BAU/ml Negative    54.  Dog Epithelia Negative    55. Mixed Feathers Negative    56. Horse Epithelia Negative    57. Cockroach, German 3+    58. Mouse Negative    59. Tobacco Leaf Negative  Intradermal - 08/08/22 1026     Time Antigen Placed 1030    Allergen Manufacturer Greer    Location Arm    Number of Test 12    Control Negative    Guatemala Negative    Johnson Negative    7 Grass Negative    Weed mix 3+    Mold 1 Negative    Mold 2 2+    Mold 3 Negative    Mold 4 2+    Cat 2+    Dog 3+    Mite mix 4+             Allergy testing results were read and interpreted by myself, documented by clinical staff.         Salvatore Marvel, MD [facility]

## 2022-08-08 NOTE — Patient Instructions (Addendum)
1. Moderate persistent asthma, uncomplicated - Lung testing was in the mid 60% range, but did improve slightly with the albuterol nebulizer treatment. - We are going to get some labs to rule out weird causes of difficult to control asthma, including those that might be caused by her workplace. - We we will call you in 1 to 2 weeks for the results of the testing. - In the meantime, we are going to change you to Trelegy instead of Symbicort (this is a once daily medication containing three different medications to help keep inflammation under control).  - Sample and copay card provided today.  - We might start an injectable medication to help the Trelegy work better, depending on the labs and how you do between now and the next visit.  - Spacer sample and demonstration provided. - Daily controller medication(s): Singulair 10mg  daily and Trelegy 200/62.5/25 one puff once daily - Prior to physical activity: albuterol 2 puffs 10-15 minutes before physical activity. - Rescue medications: albuterol 2 puffs every 4-6 hours as needed - Asthma control goals:  * Full participation in all desired activities (may need albuterol before activity) * Albuterol use two time or less a week on average (not counting use with activity) * Cough interfering with sleep two time or less a month * Oral steroids no more than once a year * No hospitalizations  2. Seasonal and perennial allergic rhinitis - Testing today showed: ragweed, weeds, trees, indoor molds, outdoor molds, dust mites, cat, dog, and cockroach. - Copy of test results provided.  - Avoidance measures provided. - Stop taking:  - Continue with: Singulair (montelukast) 10mg  daily - Start taking: Xyzal (levocetirizine) 5mg  tablet once daily and Flonase (fluticasone) one spray per nostril daily (AIM FOR EAR ON EACH SIDE) - You can use an extra dose of the antihistamine, if needed, for breakthrough symptoms.  - Consider nasal saline rinses 1-2 times daily  to remove allergens from the nasal cavities as well as help with mucous clearance (this is especially helpful to do before the nasal sprays are given) - Strongly consider allergy shots as a means of long-term control. - Allergy shots "re-train" and "reset" the immune system to ignore environmental allergens and decrease the resulting immune response to those allergens (sneezing, itchy watery eyes, runny nose, nasal congestion, etc).    - Allergy shots improve symptoms in 75-85% of patients.  - We can discuss more at the next appointment if the medications are not working for you.  3. Return in about 4 weeks (around 09/05/2022). You can have the follow up appointment with Dr. Ernst Bowler or a Nurse Practicioner (our Nurse Practitioners are excellent and always have Physician oversight!).    Please inform us of any Emergency Department visits, hospitalizations, or changes in symptoms. Call us before going to the ED for breathing or allergy symptoms since we might be able to fit you in for a sick visit. Feel free to contact us anytime with any questions, problems, or concerns.  It was a pleasure to meet you today!  Websites that have reliable patient information: 1. American Academy of Asthma, Allergy, and Immunology: www.aaaai.org 2. Food Allergy Research and Education (FARE): foodallergy.org 3. Mothers of Asthmatics: http://www.asthmacommunitynetwork.org 4. American College of Allergy, Asthma, and Immunology: www.acaai.org   COVID-19 Vaccine Information can be found at: [email or web address] For questions related to vaccine distribution or appointments, please email [email or web address] or call [phone].   We realize that you might be concerned about having an allergic reaction to the  COVID19 vaccines. To help with that concern, WE ARE OFFERING THE COVID19 VACCINES IN OUR OFFICE! Ask the front desk for dates!     "Like" [place] on Facebook  and Instagram for our latest updates!      A healthy democracy works best when [employer] participate! Make sure you are registered to vote! If you have moved or changed any of your contact information, you will need to get this updated before voting!  In some cases, you MAY be able to register to vote online: [email or web address]      Airborne Adult Perc - 08/08/22 0947     Time Antigen Placed 0947    Allergen Manufacturer Lavella Hammock    Location Back    Number of Test 57    1. Control-Buffer 50% Glycerol Negative    2. Control-Histamine 1 mg/ml 2+    3. Albumin saline Negative    4. [place] Negative    5. [place] Negative    6. Johnson Negative    7. Georgiana Blue Negative    8. Ebony Omitted    9. Perennial Rye Omitted    10. Sweet Vernal Negative    11. Timothy Negative    12. Cocklebur Negative    13. Burweed Marshelder Negative    14. Ragweed, short 4+    15. Ragweed, Giant Negative    16. Plantain,  English Negative    17. Lamb's Quarters Negative    18. Sheep Sorrell Negative    19. Rough Pigweed Negative    20. Marsh Elder, Rough Negative    21. Mugwort, Common Negative    22. Ash mix Negative    23. Birch mix Negative    24. Beech American 3+    25. Box, Elder Negative    26. Cedar, red Negative    27. Cottonwood, [place] Negative    28. Elm mix Negative    29. Hickory Negative    30. Maple mix Negative    31. Oak, [place] mix Negative    32. Pecan Pollen Negative    33. Pine mix Negative    34. Sycamore Eastern Negative    35. Benedict, Black Pollen Negative    36. Alternaria alternata Negative    37. Cladosporium Herbarum Negative    38. Aspergillus mix Negative    39. Penicillium mix Negative    40. Bipolaris sorokiniana (Helminthosporium) Negative    41. Drechslera spicifera (Curvularia) Negative    42. Mucor plumbeus Negative    43. Fusarium moniliforme Negative    44. Aureobasidium pullulans (pullulara) Negative     45. Rhizopus oryzae Negative    46. Botrytis cinera Negative    47. Epicoccum nigrum Negative    48. Phoma betae Negative    49. Candida Albicans Negative    50. Trichophyton mentagrophytes Negative    51. Mite, D Farinae  5,000 AU/ml Negative    52. Mite, D Pteronyssinus  5,000 AU/ml Negative    53. Cat Hair 10,000 BAU/ml Negative    54.  Dog Epithelia Negative    55. Mixed Feathers Negative    56. Horse Epithelia Negative    57. Cockroach, German 3+    58. Mouse Negative    59. Tobacco Leaf Negative             Intradermal - 08/08/22 1026     Time Antigen Placed 1030    Allergen Manufacturer Greer    Location Arm    Number of Test 12  Control Negative    [place] Negative    Johnson Negative    7 Grass Negative    Weed mix 3+    Mold 1 Negative    Mold 2 2+    Mold 3 Negative    Mold 4 2+    Cat 2+    Dog 3+    Mite mix 4+            Reducing Pollen Exposure  The American Academy of Allergy, Asthma and Immunology suggests the following steps to reduce your exposure to pollen during allergy seasons.    Do not hang sheets or clothing out to dry; pollen may collect on these items. Do not mow lawns or spend time around freshly cut grass; mowing stirs up pollen. Keep windows closed at night.  Keep car windows closed while driving. Minimize morning activities outdoors, a time when pollen counts are usually at their highest. Stay indoors as much as possible when pollen counts or humidity is high and on windy days when pollen tends to remain in the air longer. Use air conditioning when possible.  Many air conditioners have filters that trap the pollen spores. Use a HEPA room air filter to remove pollen form the indoor air you breathe.  Control of Mold Allergen   Mold and fungi can grow on a variety of surfaces provided certain temperature and moisture conditions exist.  Outdoor molds grow on plants, decaying vegetation and soil.  The major outdoor mold,  Alternaria and Cladosporium, are found in very high numbers during hot and dry conditions.  Generally, a late Summer - Fall peak is seen for common outdoor fungal spores.  Rain will temporarily lower outdoor mold spore count, but counts rise rapidly when the rainy period ends.  The most important indoor molds are Aspergillus and Penicillium.  Dark, humid and poorly ventilated basements are ideal sites for mold growth.  The next most common sites of mold growth are the bathroom and the kitchen.   Indoor (Perennial) Mold Control   Positive indoor molds via skin testing: Aspergillus, Penicillium, Fusarium, Aureobasidium (Pullulara), and Rhizopus  Maintain humidity below 50%. Clean washable surfaces with 5% bleach solution. Remove sources e.g. contaminated carpets.    Control of Dog or Cat Allergen  Avoidance is the best way to manage a dog or cat allergy. If you have a dog or cat and are allergic to dog or cats, consider removing the dog or cat from the home. If you have a dog or cat but don't want to find it a new home, or if your family wants a pet even though someone in the household is allergic, here are some strategies that may help keep symptoms at bay:  Keep the pet out of your bedroom and restrict it to only a few rooms. Be advised that keeping the dog or cat in only one room will not limit the allergens to that room. Don't pet, hug or kiss the dog or cat; if you do, wash your hands with soap and water. High-efficiency particulate air (HEPA) cleaners run continuously in a bedroom or living room can reduce allergen levels over time. Regular use of a high-efficiency vacuum cleaner or a central vacuum can reduce allergen levels. Giving your dog or cat a bath at least once a week can reduce airborne allergen.  Control of Dust Mite Allergen    Dust mites play a major role in allergic asthma and rhinitis.  They occur in environments with high humidity wherever  human skin is found.  Dust  mites absorb humidity from the atmosphere (ie, they do not drink) and feed on organic matter (including shed human and animal skin).  Dust mites are a microscopic type of insect that you cannot see with the naked eye.  High levels of dust mites have been detected from mattresses, pillows, carpets, upholstered furniture, bed covers, clothes, soft toys and any woven material.  The principal allergen of the dust mite is found in its feces.  A gram of dust may contain 1,000 mites and 250,000 fecal particles.  Mite antigen is easily measured in the air during house cleaning activities.  Dust mites do not bite and do not cause harm to humans, other than by triggering allergies/asthma.    Ways to decrease your exposure to dust mites in your home:  Encase mattresses, box springs and pillows with a mite-impermeable barrier or cover   Wash sheets, blankets and drapes weekly in hot water (130 F) with detergent and dry them in a dryer on the hot setting.  Have the room cleaned frequently with a vacuum cleaner and a damp dust-mop.  For carpeting or rugs, vacuuming with a vacuum cleaner equipped with a high-efficiency particulate air (HEPA) filter.  The dust mite allergic individual should not be in a room which is being cleaned and should wait 1 hour after cleaning before going into the room. Do not sleep on upholstered furniture (eg, couches).   If possible removing carpeting, upholstered furniture and drapery from the home is ideal.  Horizontal blinds should be eliminated in the rooms where the person spends the most time (bedroom, study, television room).  Washable vinyl, roller-type shades are optimal. Remove all non-washable stuffed toys from the bedroom.  Wash stuffed toys weekly like sheets and blankets above.   Reduce indoor humidity to less than 50%.  Inexpensive humidity monitors can be purchased at most hardware stores.  Do not use a humidifier as can make the problem worse and are not  recommended.  Control of Cockroach Allergen  Cockroach allergen has been identified as an important cause of acute attacks of asthma, especially in urban settings.  There are fifty-five species of cockroach that exist in the [place], however only three, the [place], [place] species produce allergen that can affect patients with Asthma.  Allergens can be obtained from fecal particles, egg casings and secretions from cockroaches.    Remove food sources. Reduce access to water. Seal access and entry points. Spray runways with 0.5-1% Diazinon or Chlorpyrifos Blow boric acid power under stoves and refrigerator. Place bait stations (hydramethylnon) at feeding sites.  Allergy Shots  Allergies are the result of a chain reaction that starts in the immune system. Your immune system controls how your body defends itself. For instance, if you have an allergy to pollen, your immune system identifies pollen as an invader or allergen. Your immune system overreacts by producing antibodies called Immunoglobulin E (IgE). These antibodies travel to cells that release chemicals, causing an allergic reaction.  The concept behind allergy immunotherapy, whether it is received in the form of shots or tablets, is that the immune system can be desensitized to specific allergens that trigger allergy symptoms. Although it requires time and patience, the payback can be long-term relief. Allergy injections contain a dilute solution of those substances that you are allergic to based upon your skin testing and allergy history.   How Do Allergy Shots Work?  Allergy shots work much like a vaccine. Your  body responds to injected amounts of a particular allergen given in increasing doses, eventually developing a resistance and tolerance to it. Allergy shots can lead to decreased, minimal or no allergy symptoms.  There generally are two phases: build-up and maintenance. Build-up often ranges from three to six  months and involves receiving injections with increasing amounts of the allergens. The shots are typically given once or twice a week, though more rapid build-up schedules are sometimes used.  The maintenance phase begins when the most effective dose is reached. This dose is different for each person, depending on how allergic you are and your response to the build-up injections. Once the maintenance dose is reached, there are longer periods between injections, typically two to four weeks.  Occasionally doctors give cortisone-type shots that can temporarily reduce allergy symptoms. These types of shots are different and should not be confused with allergy immunotherapy shots.  Who Can Be Treated with Allergy Shots?  Allergy shots may be a good treatment approach for people with allergic rhinitis (hay fever), allergic asthma, conjunctivitis (eye allergy) or stinging insect allergy.   Before deciding to begin allergy shots, you should consider:   The length of allergy season and the severity of your symptoms  Whether medications and/or changes to your environment can control your symptoms  Your desire to avoid long-term medication use  Time: allergy immunotherapy requires a major time commitment  Cost: may vary depending on your insurance coverage  Allergy shots for children age 21 and older are effective and often well tolerated. They might prevent the onset of new allergen sensitivities or the progression to asthma.  Allergy shots are not started on patients who are pregnant but can be continued on patients who become pregnant while receiving them. In some patients with other medical conditions or who take certain common medications, allergy shots may be of risk. It is important to mention other medications you talk to your allergist.   What are the two types of build-ups offered:   RUSH or Rapid Desensitization -- one day of injections lasting from 8:30-4:30pm, injections every 1 hour.   Approximately half of the build-up process is completed in that one day.  The following week, normal build-up is resumed, and this entails ~16 visits either weekly or twice weekly, until reaching your "maintenance dose" which is continued weekly until eventually getting spaced out to every month for a duration of 3 to 5 years. The regular build-up appointments are nurse visits where the injections are administered, followed by required monitoring for 30 minutes.    Traditional build-up -- weekly visits for 6 -12 months until reaching "maintenance dose", then continue weekly until eventually spacing out to every 4 weeks as above. At these appointments, the injections are administered, followed by required monitoring for 30 minutes.     Either way is acceptable, and both are equally effective. With the rush protocol, the advantage is that less time is spent here for injections overall AND you would also reach maintenance dosing faster (which is when the clinical benefit starts to become more apparent). Not everyone is a candidate for rapid desensitization.   IF we proceed with the RUSH protocol, there are premedications which must be taken the day before and the day after the rush only (this includes antihistamines, steroids, and Singulair).  After the rush day, no prednisone or Singulair is required, and we just recommend antihistamines taken on your injection day.  What Is An Estimate of the Costs?  If you are  interested in starting allergy injections, please check with your insurance company about your coverage for both allergy vial sets and allergy injections.  Please do so prior to making the appointment to start injections.  The following are CPT codes to give to your insurance company. These are the amounts we BILL to the insurance company, but the amount YOU WILL PAY and [place] and depends on the contracts we have with different insurance companies.   Amount Billed to  Insurance One allergy vial set  CPT 95165   $ 1200     Two allergy vial set  CPT 95165   $ 2400     Three allergy vial set  CPT 95165   $ 3600     One injection   CPT 95115   $ 35  Two injections   CPT 95117   $ 40 RUSH (Rapid Desensitization) CPT 95180 x 8 hours $500/hour  Regarding the allergy injections, your co-pay may or may not apply with each injection, so please confirm this with your insurance company. When you start allergy injections, 1 or 2 sets of vials are made based on your allergies.  Not all patients can be on one set of vials. A set of vials lasts 6 months to a year depending on how quickly you can proceed with your build-up of your allergy injections. Vials are personalized for each patient depending on their specific allergens.  How often are allergy injection given during the build-up period?   Injections are given at least weekly during the build-up period until your maintenance dose is achieved. Per the doctor's discretion, you may have the option of getting allergy injections two times per week during the build-up period. However, there must be at least 48 hours between injections. The build-up period is usually completed within 6-12 months depending on your ability to schedule injections and for adjustments for reactions. When maintenance dose is reached, your injection schedule is gradually changed to every two weeks and later to every three weeks. Injections will then continue every 4 weeks. Usually, injections are continued for a total of 3-5 years.   When Will I Feel Better?  Some may experience decreased allergy symptoms during the build-up phase. For others, it may take as long as 12 months on the maintenance dose. If there is no improvement after a year of maintenance, your allergist will discuss other treatment options with you.  If you aren't responding to allergy shots, it may be because there is not enough dose of the allergen in your vaccine or there are missing  allergens that were not identified during your allergy testing. Other reasons could be that there are high levels of the allergen in your environment or major exposure to non-allergic triggers like tobacco smoke.  What Is the Length of Treatment?  Once the maintenance dose is reached, allergy shots are generally continued for three to five years. The decision to stop should be discussed with your allergist at that time. Some people may experience a permanent reduction of allergy symptoms. Others may relapse and a longer course of allergy shots can be considered.  What Are the Possible Reactions?  The two types of adverse reactions that can occur with allergy shots are local and systemic. Common local reactions include very mild redness and swelling at the injection site, which can happen immediately or several hours after. Report a delayed reaction from your last injection. These include arm swelling or runny nose, watery eyes or  cough that occurs within 12-24 hours after injection. A systemic reaction, which is less common, affects the entire body or a particular body system. They are usually mild and typically respond quickly to medications. Signs include increased allergy symptoms such as sneezing, a stuffy nose or hives.   Rarely, a serious systemic reaction called anaphylaxis can develop. Symptoms include swelling in the throat, wheezing, a feeling of tightness in the chest, nausea or dizziness. Most serious systemic reactions develop within 30 minutes of allergy shots. This is why it is strongly recommended you wait in your doctor's office for 30 minutes after your injections. Your allergist is trained to watch for reactions, and his or her staff is trained and equipped with the proper medications to identify and treat them.   Report to the nurse immediately if you experience any of the following symptoms: swelling, itching or redness of the skin, hives, watery eyes/nose, breathing difficulty,  excessive sneezing, coughing, stomach pain, diarrhea, or light headedness. These symptoms may occur within 15-20 minutes after injection and may require medication.   Who Should Administer Allergy Shots?  The preferred location for receiving shots is your prescribing allergist's office. Injections can sometimes be given at another facility where the physician and staff are trained to recognize and treat reactions, and have received instructions by your prescribing allergist.  What if I am late for an injection?   Injection dose will be adjusted depending upon how many days or weeks you are late for your injection.   What if I am sick?   Please report any illness to the nurse before receiving injections. She may adjust your dose or postpone injections depending on your symptoms. If you have fever, flu, sinus infection or chest congestion it is best to postpone allergy injections until you are better. Never get an allergy injection if your asthma is causing you problems. If your symptoms persist, seek out medical care to get your health problem under control.  What If I am or Become Pregnant:  Women that become pregnant should schedule an appointment with [facility] before receiving any further allergy injections.

## 2022-08-14 LAB — ASPERGILLUS PRECIPITINS
A.Fumigatus #1 Abs: NEGATIVE
Aspergillus Flavus Antibodies: NEGATIVE
Aspergillus Niger Antibodies: NEGATIVE
Aspergillus glaucus IgG: NEGATIVE
Aspergillus nidulans IgG: NEGATIVE
Aspergillus terreus IgG: NEGATIVE

## 2022-08-14 LAB — ANCA TITERS
Atypical pANCA: <1:20 {titer}
C-ANCA: <1:20 {titer}
P-ANCA: <1:20 {titer}

## 2022-08-14 LAB — ALPHA-1-ANTITRYPSIN: A-1 Antitrypsin: 138 mg/dL (ref 101–187)

## 2022-08-14 LAB — IGE: IgE (Immunoglobulin E), Serum: 45 IU/mL (ref 6–495)

## 2022-08-15 NOTE — Addendum Note (Signed)
Addended by: Dollene Cleveland R on: 08/15/2022 01:43 PM   Modules accepted: Orders

## 2022-08-15 NOTE — Progress Notes (Signed)
Currently having Rachelle look into this.

## 2022-08-26 ENCOUNTER — Ambulatory Visit (INDEPENDENT_AMBULATORY_CARE_PROVIDER_SITE_OTHER): Payer: Managed Care, Other (non HMO) | Admitting: Dermatology

## 2022-08-26 ENCOUNTER — Encounter: Payer: Self-pay | Admitting: Dermatology

## 2022-08-26 VITALS — BP 140/80 | HR 62

## 2022-08-26 DIAGNOSIS — D170 Benign lipomatous neoplasm of skin and subcutaneous tissue of head, face and neck: Secondary | ICD-10-CM

## 2022-08-26 DIAGNOSIS — L720 Epidermal cyst: Secondary | ICD-10-CM

## 2022-08-26 NOTE — Progress Notes (Signed)
   New Patient Visit   Subjective  William Brandt is a 42 y.o. male who presents for the following: lesion on neck and below right shoulder blade. Thinks could be lipomas. Would like to discuss removal. Bothersome sometimes when trying to sleep, obstructs range of motion with neck. Dur: several years neck. Has gotten larger over time.    The following portions of the chart were reviewed this encounter and updated as appropriate: medications, allergies, medical history  Review of Systems:  No other skin or systemic complaints except as noted in HPI or Assessment and Plan.  Objective  Well appearing patient in no apparent distress; mood and affect are within normal limits.  A focused examination was performed of the following areas: Neck and back  Relevant exam findings are noted in the Assessment and Plan.     Assessment & Plan    Subcutaneous nodule. Lipoma vs other.   Exam: 8.5 x 5 cm subcutaneous rubbery nodule  Location: posterior neck  Benign-appearing. Exam most consistent with a lipoma. Discussed that a lipoma is a benign fatty growth that can grow over time and sometimes get irritated. Recommend observation if it is not bothersome or changing. Discussed option of ILK injections or surgical excision to remove it if it is growing, symptomatic, or other changes noted. Please call for new or changing lesions so they can be evaluated.   Due to size and location will refer to surgery.  Due to location checking labs to rule out "buffalo hump"  of Cushing's   Lipoma of neck  Related Procedures Ambulatory referral to Plastic Surgery Cortisol, free, Serum Cortisol, urine, free ACTH   EPIDERMAL INCLUSION CYST Exam: Subcutaneous nodule at right upper back  Benign-appearing. Exam most consistent with an epidermal inclusion cyst. Discussed that a cyst is a benign growth that can grow over time and sometimes get irritated or inflamed. Recommend observation if it is not  bothersome. Discussed option of surgical excision to remove it if it is growing, symptomatic, or other changes noted. Please call for new or changing lesions so they can be evaluated.     Return if symptoms worsen or fail to improve.  I, Lawson Radar, CMA, am acting as scribe for Langston Reusing, MD.   Documentation: I have reviewed the above documentation for accuracy and completeness, and I agree with the above.  Langston Reusing, MD

## 2022-08-26 NOTE — Patient Instructions (Addendum)
William Simpson, DO [facility] [address] [place], [place] [address] [phone]   Due to recent changes in healthcare laws, you may see results of your pathology and/or laboratory studies on MyChart before the doctors have had a chance to review them. We understand that in some cases there may be results that are confusing or concerning to you. Please understand that not all results are received at the same time and often the doctors may need to interpret multiple results in order to provide you with the best plan of care or course of treatment. Therefore, we ask that you please give [place] 2 business days to thoroughly review all your results before contacting the office for clarification. Should we see a critical lab result, you will be contacted sooner.   If You Need Anything After Your Visit  If you have any questions or concerns for your doctor, please call our main line at [phone] If no one answers, please leave a voicemail as directed and we will return your call as soon as possible. Messages left after 4 pm will be answered the following business day.   You may also send [place] a message via MyChart. We typically respond to MyChart messages within 1-2 business days.  For prescription refills, please ask your pharmacy to contact our office. Our fax number is [phone].  If you have an urgent issue when the clinic is closed that cannot wait until the next business day, you can page your doctor at the number below.    Please note that while we do our best to be available for urgent issues outside of office hours, we are not available 24/7.   If you have an urgent issue and are unable to reach [place], you may choose to seek medical care at your doctor's office, retail clinic, urgent care center, or emergency room.  If you have a medical emergency, please immediately call 911 or go to the emergency department. In the event of inclement weather,  please call our main line at [phone] for an update on the status of any delays or closures.  Dermatology Medication Tips: Please keep the boxes that topical medications come in in order to help keep track of the instructions about where and how to use these. Pharmacies typically print the medication instructions only on the boxes and not directly on the medication tubes.   If your medication is too expensive, please contact our office at [phone] or send [place] a message through MyChart.   We are unable to tell what your co-pay for medications will be in advance as this is different depending on your insurance coverage. However, we may be able to find a substitute medication at lower cost or fill out paperwork to get insurance to cover a needed medication.   If a prior authorization is required to get your medication covered by your insurance company, please allow [place] 1-2 business days to complete this process.  Drug prices often vary depending on where the prescription is filled and some pharmacies may offer cheaper prices.  The website www.goodrx.com contains coupons for medications through different pharmacies. The prices here do not account for what the cost may be with help from insurance (it may be cheaper with your insurance), but the website can give you the price if you did not use any insurance.  - You can print the associated coupon and take it with your prescription to the pharmacy.  - You may also stop  by our office during regular business hours and pick up a GoodRx coupon card.  - If you need your prescription sent electronically to a different pharmacy, notify our office through [facility] or by phone at [phone]

## 2022-09-01 LAB — CORTISOL, FREE: Cortisol, Free Dialysis, LCMS: 0.609 ug/dL

## 2022-09-01 LAB — ACTH: ACTH: 32.2 pg/mL (ref 7.2–63.3)

## 2022-09-02 LAB — CORTISOL, URINE, FREE
Cortisol (Ur), Free: 36 ug/24 hr (ref 5–64)
Cortisol,F,ug/L,U: 18 ug/L

## 2022-09-19 ENCOUNTER — Other Ambulatory Visit: Payer: Self-pay

## 2022-09-19 ENCOUNTER — Encounter: Payer: Self-pay | Admitting: Allergy & Immunology

## 2022-09-19 ENCOUNTER — Ambulatory Visit (INDEPENDENT_AMBULATORY_CARE_PROVIDER_SITE_OTHER): Payer: Managed Care, Other (non HMO) | Admitting: Allergy & Immunology

## 2022-09-19 VITALS — BP 126/86 | HR 81 | Temp 97.8°F | Resp 16 | Ht 66.0 in | Wt 259.5 lb

## 2022-09-19 DIAGNOSIS — F17200 Nicotine dependence, unspecified, uncomplicated: Secondary | ICD-10-CM | POA: Diagnosis not present

## 2022-09-19 DIAGNOSIS — J302 Other seasonal allergic rhinitis: Secondary | ICD-10-CM

## 2022-09-19 DIAGNOSIS — J3089 Other allergic rhinitis: Secondary | ICD-10-CM

## 2022-09-19 DIAGNOSIS — J4540 Moderate persistent asthma, uncomplicated: Secondary | ICD-10-CM | POA: Diagnosis not present

## 2022-09-19 MED ORDER — MONTELUKAST SODIUM 10 MG PO TABS: 10.0000 mg | ORAL_TABLET | Freq: Every day | ORAL | 5 refills | Status: AC

## 2022-09-19 MED ORDER — ALBUTEROL SULFATE HFA 108 (90 BASE) MCG/ACT IN AERS: 1.0000 | INHALATION_SPRAY | Freq: Four times a day (QID) | RESPIRATORY_TRACT | 2 refills | Status: AC | PRN

## 2022-09-19 MED ORDER — LEVOCETIRIZINE DIHYDROCHLORIDE 5 MG PO TABS: 5.0000 mg | ORAL_TABLET | Freq: Every evening | ORAL | 5 refills | Status: AC

## 2022-09-19 MED ORDER — TRELEGY ELLIPTA 200-62.5-25 MCG/ACT IN AEPB: 1.0000 | INHALATION_SPRAY | Freq: Every day | RESPIRATORY_TRACT | 5 refills | Status: AC

## 2022-09-19 MED ORDER — FLUTICASONE PROPIONATE 50 MCG/ACT NA SUSP: 2.0000 | Freq: Every day | NASAL | 5 refills | Status: AC

## 2022-09-19 NOTE — Patient Instructions (Addendum)
1. Moderate persistent asthma, uncomplicated - Lung testing was in the higher 60% range, so still not normal. - We are getting a complete blood count to see if you have elevated eosinophils (white blood cells that make asthma worse).  - Daily controller medication(s): Singulair 10mg  daily and Trelegy 200/62.5/25 one puff once daily - Prior to physical activity: albuterol 2 puffs 10-15 minutes before physical activity. - Rescue medications: albuterol 2 puffs every 4-6 hours as needed - Asthma control goals:  * Full participation in all desired activities (may need albuterol before activity) * Albuterol use two time or less a week on average (not counting use with activity) * Cough interfering with sleep two time or less a month * Oral steroids no more than once a year * No hospitalizations  2. Seasonal and perennial allergic rhinitis - Previous testing showed: ragweed, weeds, trees, indoor molds, outdoor molds, dust mites, cat, dog, and cockroach - Continue with: Singulair (montelukast) 10mg  daily - Continue with: Xyzal (levocetirizine) 5mg  tablet once daily and Flonase (fluticasone) one spray per nostril daily (AIM FOR EAR ON EACH SIDE) - You can use an extra dose of the antihistamine, if needed, for breakthrough symptoms.  - Consider nasal saline rinses 1-2 times daily to remove allergens from the nasal cavities as well as help with mucous clearance (this is especially helpful to do before the nasal sprays are given) - Strongly consider allergy shots as a means of long-term control. - Allergy shots "re-train" and "reset" the immune system to ignore environmental allergens and decrease the resulting immune response to those allergens (sneezing, itchy watery eyes, runny nose, nasal congestion, etc).    - Allergy shots improve symptoms in 75-85% of patients.  - We can discuss more at the next appointment if the medications are not working for you.  3. Return in about 3 months (around  12/20/2022). You can have the follow up appointment with Dr. Dellis Anes or a Nurse Practicioner (our Nurse Practitioners are excellent and always have Physician oversight!).    Please inform us of any Emergency Department visits, hospitalizations, or changes in symptoms. Call us before going to the ED for breathing or allergy symptoms since we might be able to fit you in for a sick visit. Feel free to contact us anytime with any questions, problems, or concerns.  It was a pleasure to see you again today! Congratulations on the new job!   Websites that have reliable patient information: 1. American Academy of Asthma, Allergy, and Immunology: www.aaaai.org 2. Food Allergy Research and Education (FARE): foodallergy.org 3. Mothers of Asthmatics: http://www.asthmacommunitynetwork.org 4. American College of Allergy, Asthma, and Immunology: www.acaai.org   COVID-19 Vaccine Information can be found at: [email or web address].[email or web address] For questions related to vaccine distribution or appointments, please email [email or web address] or call [phone].   We realize that you might be concerned about having an allergic reaction to the COVID19 vaccines. To help with that concern, WE ARE OFFERING THE COVID19 VACCINES IN OUR OFFICE! Ask the front desk for dates!     "Like" [place] on Facebook and Instagram for our latest updates!      A healthy democracy works best when [employer] participate! Make sure you are registered to vote! If you have moved or changed any of your contact information, you will need to get this updated before voting!  In some cases, you MAY be able to register to vote online: [email or web address]

## 2022-09-19 NOTE — Progress Notes (Signed)
FOLLOW UP  Date of Service/Encounter:  09/19/22   Assessment:   Moderate persistent asthma, uncomplicated   Current smoker   Seasonal and perennial allergic rhinitis (ragweed, weeds, trees, indoor molds, outdoor molds, dust mites, cat, dog, and cockroach)  Plan/Recommendations:   1. Moderate persistent asthma, uncomplicated - Lung testing was in the higher 60% range, so still not normal. - We are getting a complete blood count to see if you have elevated eosinophils (white blood cells that make asthma worse).  - Daily controller medication(s): Singulair 10mg  daily and Trelegy 200/62.5/25 one puff once daily - Prior to physical activity: albuterol 2 puffs 10-15 minutes before physical activity. - Rescue medications: albuterol 2 puffs every 4-6 hours as needed - Asthma control goals:  * Full participation in all desired activities (may need albuterol before activity) * Albuterol use two time or less a week on average (not counting use with activity) * Cough interfering with sleep two time or less a month * Oral steroids no more than once a year * No hospitalizations  2. Seasonal and perennial allergic rhinitis - Previous testing showed: ragweed, weeds, trees, indoor molds, outdoor molds, dust mites, cat, dog, and cockroach - Continue with: Singulair (montelukast) 10mg  daily - Continue with: Xyzal (levocetirizine) 5mg  tablet once daily and Flonase (fluticasone) one spray per nostril daily (AIM FOR EAR ON EACH SIDE) - You can use an extra dose of the antihistamine, if needed, for breakthrough symptoms.  - Consider nasal saline rinses 1-2 times daily to remove allergens from the nasal cavities as well as help with mucous clearance (this is especially helpful to do before the nasal sprays are given) - Strongly consider allergy shots as a means of long-term control. - Allergy shots "re-train" and "reset" the immune system to ignore environmental allergens and decrease the resulting  immune response to those allergens (sneezing, itchy watery eyes, runny nose, nasal congestion, etc).    - Allergy shots improve symptoms in 75-85% of patients.  - We can discuss more at the next appointment if the medications are not working for you.  3. Return in about 3 months (around 12/20/2022). You can have the follow up appointment with Dr. Dellis Anes or a Nurse Practicioner (our Nurse Practitioners are excellent and always have Physician oversight!).     Subjective:   William Brandt is a 42 y.o. male presenting today for follow up of  Chief Complaint  Patient presents with   Asthma    Some shortness of breath    Cough    Cough spell with nasal drip     William Brandt has a history of the following: Patient Active Problem List   Diagnosis Date Noted   Moderate persistent asthma, uncomplicated 08/08/2022   Seasonal and perennial allergic rhinitis 08/08/2022   Current smoker 08/08/2022    History obtained from: chart review and patient.  William Brandt is a 42 y.o. male presenting for a follow up visit.  He was last seen in April 2024.  At that time, his lung testing was in the mid 60% range but improved slightly with the albuterol treatment.  We decided to change him from Symbicort to Trelegy.  We gave him a sample and a co-pay card.  He had testing that was positive to multiple indoor and outdoor allergens.  We continue with Singulair next and started Xyzal as well as Flonase.  Since last visit, he has done well.   Asthma/Respiratory Symptom History: He thinks that the Trelegy  is working well.  He ran out of the Trelegy and did not know how to get a new one. He thinks maybe it has been a week or two. He is still coughing, but he thinks that the Trelegy did help with the symptoms. Coughing is a problem sporadically throughout the day. He is still smoking - around 1 pack every few days. The coughing was a problem even when he ws not coughing. We did have labs drawn to  look for serious causes of asthma; these were all normal. He does feel that reastarting smoking has altered the cough either way.  Allergic Rhinitis Symptom History: He ran out of the nose spray.  He never did talk to the pharmacy to get a new one. I guess he was not aware that he needed to call to get refills. Regardless, he is going to call them today to take care of that. He reports today that he prefers to not take medications, but he is not interested in pursuing allergy shots at all. He does not think that this is going to work with his work schedule and life.   He is back at [employer]. He is no longer at [employer]. He is no longer around dust like he was previously. He is working as a Biomedical scientist. He previously worked at [employer] when he first moved out here. t  Otherwise, there have been no changes to his past medical history, surgical history, family history, or social history.    Review of Systems  Constitutional: Negative.  Negative for chills, fever, malaise/fatigue and weight loss.  HENT:  Negative for congestion, ear discharge, ear pain and sinus pain.   Eyes:  Negative for pain, discharge and redness.  Respiratory:  Negative for cough, sputum production, shortness of breath and wheezing.   Cardiovascular: Negative.  Negative for chest pain and palpitations.  Gastrointestinal:  Negative for abdominal pain, constipation, diarrhea, heartburn, nausea and vomiting.  Skin: Negative.  Negative for itching and rash.  Neurological:  Negative for dizziness and headaches.  Endo/Heme/Allergies:  Positive for environmental allergies. Does not bruise/bleed easily.       Objective:   Blood pressure 126/86, pulse 81, temperature 97.8 F (36.6 C), resp. rate 16, height 5\' 6"  (1.676 m), weight 259 lb 8 oz (117.7 kg), SpO2 96 %. Body mass index is 41.88 kg/m.    Physical Exam Vitals reviewed.  Constitutional:      Appearance: He is well-developed.  HENT:     Head:  Normocephalic and atraumatic.     Right Ear: Tympanic membrane, ear canal and external ear normal. No drainage, swelling or tenderness. Tympanic membrane is not injected, scarred, erythematous, retracted or bulging.     Left Ear: Tympanic membrane, ear canal and external ear normal. No drainage, swelling or tenderness. Tympanic membrane is not injected, scarred, erythematous, retracted or bulging.     Nose: No nasal deformity, septal deviation, mucosal edema or rhinorrhea.     Right Turbinates: Enlarged, swollen and pale.     Left Turbinates: Enlarged, swollen and pale.     Right Sinus: No maxillary sinus tenderness or frontal sinus tenderness.     Left Sinus: No maxillary sinus tenderness or frontal sinus tenderness.     Comments: No polyps that I can appreciate.    Mouth/Throat:     Lips: Pink.     Mouth: Mucous membranes are moist. Mucous membranes are not pale and not dry.     Pharynx: Uvula midline.     Comments:  Cobblestoning present. Eyes:     General: Lids are normal. Allergic shiner present.        Right eye: No discharge.        Left eye: No discharge.     Conjunctiva/sclera: Conjunctivae normal.     Right eye: Right conjunctiva is not injected. No chemosis.    Left eye: Left conjunctiva is not injected. No chemosis.    Pupils: Pupils are equal, round, and reactive to light.  Cardiovascular:     Rate and Rhythm: Normal rate and regular rhythm.     Heart sounds: Normal heart sounds.  Pulmonary:     Effort: Pulmonary effort is normal. No tachypnea, accessory muscle usage or respiratory distress.     Breath sounds: No decreased air movement or transmitted upper airway sounds. Examination of the left-middle field reveals wheezing. Examination of the left-lower field reveals wheezing. Wheezing present. No rhonchi or rales.  Chest:     Chest wall: No tenderness.  Abdominal:     Tenderness: There is no abdominal tenderness. There is no guarding or rebound.  Lymphadenopathy:      Head:     Right side of head: No submandibular, tonsillar or occipital adenopathy.     Left side of head: No submandibular, tonsillar or occipital adenopathy.     Cervical: No cervical adenopathy.  Skin:    Coloration: Skin is not pale.     Findings: No abrasion, erythema, petechiae or rash. Rash is not papular, urticarial or vesicular.  Neurological:     Mental Status: He is alert.  Psychiatric:        Behavior: Behavior is cooperative.      Diagnostic studies:    Spirometry: results normal (FEV1: 2.30/69%, FVC: 3.01/73%, FEV1/FVC: 76%).    Spirometry consistent with normal pattern.   Allergy Studies: none        Malachi Bonds, MD  [facility]

## 2022-09-20 LAB — CBC WITH DIFFERENTIAL
Basophils Absolute: 0.1 10*3/uL (ref 0.0–0.2)
Basos: 1 %
EOS (ABSOLUTE): 0.3 10*3/uL (ref 0.0–0.4)
Eos: 3 %
Hematocrit: 44.1 % (ref 37.5–51.0)
Hemoglobin: 14.7 g/dL (ref 13.0–17.7)
Immature Grans (Abs): 0 10*3/uL (ref 0.0–0.1)
Immature Granulocytes: 0 %
Lymphocytes Absolute: 1.8 10*3/uL (ref 0.7–3.1)
Lymphs: 20 %
MCH: 30.5 pg (ref 26.6–33.0)
MCHC: 33.3 g/dL (ref 31.5–35.7)
MCV: 92 fL (ref 79–97)
Monocytes Absolute: 0.8 10*3/uL (ref 0.1–0.9)
Monocytes: 9 %
Neutrophils Absolute: 5.8 10*3/uL (ref 1.4–7.0)
Neutrophils: 67 %
RBC: 4.82 x10E6/uL (ref 4.14–5.80)
RDW: 12.5 % (ref 11.6–15.4)
WBC: 8.7 10*3/uL (ref 3.4–10.8)

## 2022-10-02 ENCOUNTER — Telehealth: Payer: Self-pay | Admitting: *Deleted

## 2022-10-02 NOTE — Telephone Encounter (Signed)
-----   Message from Alfonse Spruce, MD sent at 09/25/2022  8:03 AM EDT ----- Can someone call the patient to let him know that he did have an elevated eosinophil count? This would qualify him for an asthma biologic medication to help his Trelegy work better.    Malachi Bonds, MD [facility]

## 2022-10-02 NOTE — Telephone Encounter (Signed)
Called patient discussed [facility] Challenger for his asthma will work on approval and reach out to patient to advise submit. Patient wants to get injs in clinic

## 2022-10-02 NOTE — Telephone Encounter (Signed)
Spoke to patient and advised approval, copay card and submit to [facility]. Will reach out once delivery set to make appt to start therapy 

## 2022-10-04 NOTE — Telephone Encounter (Signed)
Thanks, Tammy!   Gerron Guidotti, MD [facility]  

## 2022-11-01 ENCOUNTER — Ambulatory Visit (INDEPENDENT_AMBULATORY_CARE_PROVIDER_SITE_OTHER): Payer: Managed Care, Other (non HMO) | Admitting: Plastic Surgery

## 2022-11-01 ENCOUNTER — Encounter: Payer: Self-pay | Admitting: Plastic Surgery

## 2022-11-01 VITALS — BP 133/92 | HR 79 | Resp 20 | Ht 67.0 in | Wt 259.0 lb

## 2022-11-01 DIAGNOSIS — R221 Localized swelling, mass and lump, neck: Secondary | ICD-10-CM | POA: Diagnosis not present

## 2022-11-01 NOTE — Progress Notes (Signed)
Patient ID: William Brandt, male    DOB: 1980/06/28, 42 y.o.   MRN: [id number]   Chief Complaint  Patient presents with   Consult   Skin Problem    The patient is a 42 year old male here for evaluation of a mass on the posterior neck area.  The patient states that it has been there for approximately 10 years.  It is getting larger and is tender.  He feels a restriction in his range of motion because of the size of it.  He is in good health but he does have severe allergies.  He is 5 feet 7 inches tall and weighs 259 pounds.  The lesion is on the posterior aspect of his neck and is approximately 6 x 6 cm in size.  It is soft but not very movable underneath the tissue but it does not feel stuck to the bone.    Review of Systems  Constitutional: Negative.   HENT: Negative.    Eyes: Negative.   Respiratory: Negative.  Negative for chest tightness and shortness of breath.   Cardiovascular: Negative.   Gastrointestinal: Negative.   Endocrine: Negative.   Genitourinary: Negative.   Musculoskeletal: Negative.     Past Medical History:  Diagnosis Date   Asthma     History reviewed. No pertinent surgical history.    Current Outpatient Medications:    albuterol (VENTOLIN HFA) 108 (90 Base) MCG/ACT inhaler, Inhale 1-2 puffs into the lungs every 6 (six) hours as needed for wheezing or shortness of breath., Disp: 18 g, Rfl: 2   benzonatate (TESSALON PERLES) 100 MG capsule, Take 1 capsule (100 mg total) by mouth 3 (three) times daily as needed for cough., Disp: 30 capsule, Rfl: 0   fluticasone (FLONASE) 50 MCG/ACT nasal spray, Place 2 sprays into both nostrils daily., Disp: 1 g, Rfl: 5   Fluticasone-Umeclidin-Vilant (TRELEGY ELLIPTA) 200-62.5-25 MCG/ACT AEPB, Inhale 1 puff into the lungs daily., Disp: 30 each, Rfl: 5   levocetirizine (XYZAL) 5 MG tablet, Take 1 tablet (5 mg total) by mouth every evening., Disp: 30 tablet, Rfl: 5   montelukast (SINGULAIR) 10 MG tablet, Take 1 tablet  (10 mg total) by mouth at bedtime., Disp: 30 tablet, Rfl: 5   Objective:   Vitals:   11/01/22 1245  BP: (!) 133/92  Pulse: 79  Resp: 20  SpO2: 98%    Physical Exam Vitals and nursing note reviewed.  Constitutional:      Appearance: Normal appearance.  HENT:     Head: Normocephalic and atraumatic.  Cardiovascular:     Rate and Rhythm: Normal rate.     Pulses: Normal pulses.  Abdominal:     Palpations: Abdomen is soft.  Musculoskeletal:        General: Normal range of motion.  Skin:    General: Skin is warm.     Capillary Refill: Capillary refill takes less than 2 seconds.     Coloration: Skin is not jaundiced.     Findings: No bruising.  Neurological:     Mental Status: He is alert and oriented to person, place, and time.  Psychiatric:        Mood and Affect: Mood normal.        Behavior: Behavior normal.     Assessment & Plan:  Mass of neck  Due to the location I would like to go ahead and get an ultrasound to be sure that there is no further involvement beyond just a lipoma.  The patient  is in agreement.  I have placed the order and we will plan on having a televisit afterwards to formulate a plan.  Pictures were obtained of the patient and placed in the chart with the patient's or guardian's permission.   Alena Bills Mashanda Ishibashi, DO

## 2022-11-05 ENCOUNTER — Emergency Department (HOSPITAL_BASED_OUTPATIENT_CLINIC_OR_DEPARTMENT_OTHER): Payer: Managed Care, Other (non HMO)

## 2022-11-05 ENCOUNTER — Emergency Department (HOSPITAL_BASED_OUTPATIENT_CLINIC_OR_DEPARTMENT_OTHER)
Admission: EM | Admit: 2022-11-05 | Discharge: 2022-11-05 | Disposition: A | Discharge status: Home / Self Care | Payer: Managed Care, Other (non HMO) | Attending: Emergency Medicine | Admitting: Emergency Medicine

## 2022-11-05 ENCOUNTER — Encounter (HOSPITAL_BASED_OUTPATIENT_CLINIC_OR_DEPARTMENT_OTHER): Payer: Self-pay | Admitting: Emergency Medicine

## 2022-11-05 ENCOUNTER — Other Ambulatory Visit: Payer: Self-pay

## 2022-11-05 DIAGNOSIS — U071 COVID-19: Secondary | ICD-10-CM | POA: Diagnosis not present

## 2022-11-05 DIAGNOSIS — R051 Acute cough: Secondary | ICD-10-CM

## 2022-11-05 DIAGNOSIS — J45909 Unspecified asthma, uncomplicated: Secondary | ICD-10-CM | POA: Diagnosis not present

## 2022-11-05 DIAGNOSIS — R059 Cough, unspecified: Secondary | ICD-10-CM | POA: Diagnosis present

## 2022-11-05 DIAGNOSIS — J069 Acute upper respiratory infection, unspecified: Secondary | ICD-10-CM

## 2022-11-05 LAB — SARS CORONAVIRUS 2 BY RT PCR: SARS Coronavirus 2 by RT PCR: POSITIVE — AB

## 2022-11-05 MED ORDER — ALBUTEROL SULFATE (2.5 MG/3ML) 0.083% IN NEBU: 5.0000 mg | INHALATION_SOLUTION | Freq: Once | RESPIRATORY_TRACT | Status: DC

## 2022-11-05 MED ORDER — ACETAMINOPHEN 500 MG PO TABS
1000.0000 mg | ORAL_TABLET | Freq: Once | ORAL | Status: AC
  Administered 2022-11-05: 1000 mg via ORAL
  Filled 2022-11-05: qty 2

## 2022-11-05 MED ORDER — GUAIFENESIN-DM 100-10 MG/5ML PO SYRP
15.0000 mL | ORAL_SOLUTION | Freq: Once | ORAL | Status: AC
  Administered 2022-11-05: 15 mL via ORAL
  Filled 2022-11-05: qty 15

## 2022-11-05 NOTE — ED Provider Notes (Signed)
[facility] Provider Note   CSN: [id number] Arrival date & time: 11/05/22  2056     History {Add pertinent medical, surgical, social history, OB history to HPI:1} Chief Complaint  Patient presents with   Cough    William Brandt is a 42 y.o. male.  Pt with c/o nasal congestion, mildly sore throat, coughing/non productive, in the past few days. Subjective fever. Notes hx asthma, denies recent exacerbation or wheezing. Former smoker quit ~ 1 week ago. Also states has hx multiple environmental allergies. No specific known ill contacts. No trouble breathing or swallowing. Normal appetite. No nvd. No abd pain. No extremity pain or swelling.   The history is provided by the patient and medical records.  Cough Associated symptoms: fever and sore throat   Associated symptoms: no chest pain, no headaches, no rash and no shortness of breath        Home Medications Prior to Admission medications   Medication Sig Start Date End Date Taking? Authorizing Provider  albuterol (VENTOLIN HFA) 108 (90 Base) MCG/ACT inhaler Inhale 1-2 puffs into the lungs every 6 (six) hours as needed for wheezing or shortness of breath. 09/19/22   Alfonse Spruce, MD  benzonatate (TESSALON PERLES) 100 MG capsule Take 1 capsule (100 mg total) by mouth 3 (three) times daily as needed for cough. 07/26/22   Rema Fendt, NP  fluticasone (FLONASE) 50 MCG/ACT nasal spray Place 2 sprays into both nostrils daily. 09/19/22   Alfonse Spruce, MD  Fluticasone-Umeclidin-Vilant (TRELEGY ELLIPTA) 200-62.5-25 MCG/ACT AEPB Inhale 1 puff into the lungs daily. 09/19/22   Alfonse Spruce, MD  levocetirizine (XYZAL) 5 MG tablet Take 1 tablet (5 mg total) by mouth every evening. 09/19/22   Alfonse Spruce, MD  montelukast (SINGULAIR) 10 MG tablet Take 1 tablet (10 mg total) by mouth at bedtime. 09/19/22   Alfonse Spruce, MD      Allergies    Shellfish  allergy and Dust mite extract    Review of Systems   Review of Systems  Constitutional:  Positive for fever.  HENT:  Positive for congestion, nosebleeds and sore throat. Negative for sinus pain.   Eyes:  Negative for redness.  Respiratory:  Positive for cough. Negative for shortness of breath.   Cardiovascular:  Negative for chest pain and leg swelling.  Gastrointestinal:  Negative for abdominal pain, diarrhea and vomiting.  Genitourinary:  Negative for dysuria and flank pain.  Musculoskeletal:  Negative for neck pain and neck stiffness.  Skin:  Negative for rash.  Neurological:  Negative for headaches.  Hematological:  Does not bruise/bleed easily.  Psychiatric/Behavioral:  Negative for confusion.     Physical Exam Updated Vital Signs BP (!) 122/95 (BP Location: Left Arm)   Pulse 94   Temp 98.9 F (37.2 C) (Oral)   Resp 18   Ht 1.702 m (5\' 7" )   Wt 117.5 kg   SpO2 96%   BMI 40.57 kg/m  Physical Exam Vitals and nursing note reviewed.  Constitutional:      Appearance: Normal appearance. He is well-developed.  HENT:     Head: Atraumatic.     Right Ear: Tympanic membrane normal.     Left Ear: Tympanic membrane normal.     Nose: Congestion and rhinorrhea present.     Mouth/Throat:     Mouth: Mucous membranes are moist.     Pharynx: Oropharynx is clear. No oropharyngeal exudate or posterior oropharyngeal erythema.  Eyes:  General: No scleral icterus.    Conjunctiva/sclera: Conjunctivae normal.     Pupils: Pupils are equal, round, and reactive to light.  Neck:     Trachea: No tracheal deviation.     Comments: No stiffness or rigidity Cardiovascular:     Rate and Rhythm: Normal rate and regular rhythm.     Pulses: Normal pulses.     Heart sounds: Normal heart sounds. No murmur heard.    No friction rub. No gallop.  Pulmonary:     Effort: Pulmonary effort is normal. No accessory muscle usage or respiratory distress.     Breath sounds: Normal breath sounds.   Abdominal:     General: Bowel sounds are normal. There is no distension.     Palpations: Abdomen is soft.     Tenderness: There is no abdominal tenderness.     Comments: No hsm.   Genitourinary:    Comments: No cva tenderness. Musculoskeletal:        General: No swelling or tenderness.     Cervical back: Normal range of motion and neck supple. No rigidity.     Right lower leg: No edema.     Left lower leg: No edema.  Lymphadenopathy:     Cervical: No cervical adenopathy.  Skin:    General: Skin is warm and dry.     Findings: No rash.  Neurological:     Mental Status: He is alert.     Comments: Alert, speech clear.   Psychiatric:        Mood and Affect: Mood normal.     ED Results / Procedures / Treatments   Labs (all labs ordered are listed, but only abnormal results are displayed) Results for orders placed or performed in visit on 09/19/22  CBC With Differential  Result Value Ref Range   WBC 8.7 3.4 - 10.8 x10E3/uL   RBC 4.82 4.14 - 5.80 x10E6/uL   Hemoglobin 14.7 13.0 - 17.7 g/dL   Hematocrit 40.9 81.1 - 51.0 %   MCV 92 79 - 97 fL   MCH 30.5 26.6 - 33.0 pg   MCHC 33.3 31.5 - 35.7 g/dL   RDW 91.4 78.2 - 95.6 %   Neutrophils 67 Not Estab. %   Lymphs 20 Not Estab. %   Monocytes 9 Not Estab. %   Eos 3 Not Estab. %   Basos 1 Not Estab. %   Neutrophils Absolute 5.8 1.4 - 7.0 x10E3/uL   Lymphocytes Absolute 1.8 0.7 - 3.1 x10E3/uL   Monocytes Absolute 0.8 0.1 - 0.9 x10E3/uL   EOS (ABSOLUTE) 0.3 0.0 - 0.4 x10E3/uL   Basophils Absolute 0.1 0.0 - 0.2 x10E3/uL   Immature Granulocytes 0 Not Estab. %   Immature Grans (Abs) 0.0 0.0 - 0.1 x10E3/uL   DG Chest 2 View  Result Date: 11/05/2022 CLINICAL DATA:  Productive cough and sneezing.  History of asthma. EXAM: CHEST - 2 VIEW COMPARISON:  05/13/2022. FINDINGS: The heart size and mediastinal contours are within normal limits. No consolidation, effusion, or pneumothorax. Old rib fractures are noted on the right. Degenerative  changes are present in the thoracic spine. No acute osseous abnormality is seen. IMPRESSION: No active cardiopulmonary disease. Electronically Signed   By: Thornell Sartorius M.D.   On: 11/05/2022 21:26    EKG None  Radiology DG Chest 2 View  Result Date: 11/05/2022 CLINICAL DATA:  Productive cough and sneezing.  History of asthma. EXAM: CHEST - 2 VIEW COMPARISON:  05/13/2022. FINDINGS: The heart size and mediastinal  contours are within normal limits. No consolidation, effusion, or pneumothorax. Old rib fractures are noted on the right. Degenerative changes are present in the thoracic spine. No acute osseous abnormality is seen. IMPRESSION: No active cardiopulmonary disease. Electronically Signed   By: Thornell Sartorius M.D.   On: 11/05/2022 21:26    Procedures Procedures  {Document cardiac monitor, telemetry assessment procedure when appropriate:1}  Medications Ordered in ED Medications - No data to display  ED Course/ Medical Decision Making/ A&P   {   Click here for ABCD2, HEART and other calculatorsREFRESH Note before signing :1}                          Medical Decision Making Problems Addressed: Acute cough: acute illness or injury with systemic symptoms Viral URI with cough: acute illness or injury with systemic symptoms that poses a threat to life or bodily functions  Amount and/or Complexity of Data Reviewed External Data Reviewed: notes. Labs: ordered. Decision-making details documented in ED Course. Radiology: ordered and independent interpretation performed. Decision-making details documented in ED Course.  Risk OTC drugs. Decision regarding hospitalization.   Labs ordered/sent. Imaging ordered.   Differential diagnosis includes pneumonia, covid, flu, viral uri, allergies, etc. Dispo decision including potential need for admission considered - will get labs and imaging and reassess.   Reviewed nursing notes and prior charts for additional history. External reports reviewed.    Cardiac monitor: sinus rhythm, rate 94.  Labs reviewed/interpreted by me -   Xrays reviewed/interpreted by me - no pna.   Pt is breathing comfortably, no wheezing, pulse ox 96%.   Rec close pcp f/u.  Return precautions provided.    {Document critical care time when appropriate:1} {Document review of labs and clinical decision tools ie heart score, Chads2Vasc2 etc:1}  {Document your independent review of radiology images, and any outside records:1} {Document your discussion with family members, caretakers, and with consultants:1} {Document social determinants of health affecting pt's care:1} {Document your decision making why or why not admission, treatments were needed:1} Final Clinical Impression(s) / ED Diagnoses Final diagnoses:  Viral URI with cough  Acute cough    Rx / DC Orders ED Discharge Orders     None

## 2022-11-05 NOTE — Discharge Instructions (Addendum)
It was our pleasure to provide your ER care today - we hope that you feel better. Your chest xray looks good - no pneumonia.  Your covid test is positive.   Drink plenty of fluids/stay well hydrated. Stay active, take full and deep breaths. Take mucinex, nyquil or similar over the counter cold/cough/flu medication as need for symptom relief.  Use inhaler as need. Avoid any smoking.   Follow up with primary care doctor in two weeks if symptoms fail to improve/resolve.  Return to ER if worse, new symptoms, increased trouble breathing, severe sore throat or trouble swallowing, or other concern.

## 2022-11-05 NOTE — ED Triage Notes (Signed)
Pt c/o "not feeling well" associated with generalized fatigue, chills, productive cough, nasal congestion, intermittent fever, sore throat. Sx started today. Denies known sick exposure. Reports quitting smoking cigarette 8 days ago.

## 2022-11-22 ENCOUNTER — Ambulatory Visit (INDEPENDENT_AMBULATORY_CARE_PROVIDER_SITE_OTHER): Payer: Managed Care, Other (non HMO) | Admitting: Plastic Surgery

## 2022-11-22 DIAGNOSIS — F17200 Nicotine dependence, unspecified, uncomplicated: Secondary | ICD-10-CM

## 2022-11-22 DIAGNOSIS — R221 Localized swelling, mass and lump, neck: Secondary | ICD-10-CM

## 2022-11-22 NOTE — Progress Notes (Signed)
The patient is a 42 year old male joining me by phone for further discussion about the mass on the posterior neck.  An ultrasound was ordered but it does not appear that the patient got a call from radiology.  I am going to go ahead and and get that reordered and we will contact radiology.  The patient I will talk again once we get the ultrasound.  I connected with  William Brandt on 11/22/22 by phone and verified that I am speaking with the correct person using two identifiers.  The patient was at home and I was at the office.  We spent 2 minutes in discussion.   I discussed the limitations of evaluation and management by telemedicine. The patient expressed understanding and agreed to proceed.  He also needs to stop smoking.

## 2022-11-28 ENCOUNTER — Ambulatory Visit (HOSPITAL_COMMUNITY): Payer: Managed Care, Other (non HMO)

## 2022-11-29 ENCOUNTER — Ambulatory Visit (HOSPITAL_COMMUNITY)
Admission: RE | Admit: 2022-11-29 | Discharge: 2022-11-29 | Disposition: A | Discharge status: Home / Self Care | Payer: Managed Care, Other (non HMO) | Source: Ambulatory Visit | Attending: Plastic Surgery | Admitting: Plastic Surgery

## 2022-11-29 DIAGNOSIS — R221 Localized swelling, mass and lump, neck: Secondary | ICD-10-CM | POA: Diagnosis present

## 2023-02-19 ENCOUNTER — Ambulatory Visit: Payer: Self-pay | Admitting: Internal Medicine

## 2023-03-03 ENCOUNTER — Ambulatory Visit: Payer: Self-pay | Admitting: Family Medicine

## 2023-03-03 DIAGNOSIS — J309 Allergic rhinitis, unspecified: Secondary | ICD-10-CM

## 2023-03-03 NOTE — Progress Notes (Deleted)
   [address] [place] [address] Dept: [phone]  FOLLOW UP NOTE  Patient ID: William Brandt, male    DOB: Dec 29, 1980  Age: 42 y.o. MRN: [id number] Date of Office Visit: 03/03/2023  Assessment  Chief Complaint: No chief complaint on file.  HPI William Brandt is a 42 year old male who presents to the clinic for follow-up visit.  He was last seen in this clinic on 09/19/2022 by Dr. Dellis Anes for evaluation of asthma, allergic rhinitis, and tobacco use.  His last environmental allergy testing was on 08/08/2022 and was positive to ragweed, weed pollen, tree pollen, mold, dust mite, cat, dog, and cockroach.  Discussed the use of AI scribe software for clinical note transcription with the patient, who gave verbal consent to proceed.  History of Present Illness             Drug Allergies:  Allergies  Allergen Reactions   Shellfish Allergy Anaphylaxis   Dust Mite Extract     Physical Exam: There were no vitals taken for this visit.   Physical Exam  Diagnostics:    Assessment and Plan: No diagnosis found.  No orders of the defined types were placed in this encounter.   There are no Patient Instructions on file for this visit.  No follow-ups on file.    Thank you for the opportunity to care for this patient.  Please do not hesitate to contact me with questions.  Thermon Leyland, FNP [facility]

## 2023-08-19 ENCOUNTER — Telehealth: Payer: Self-pay | Admitting: Allergy & Immunology

## 2023-08-19 NOTE — Telephone Encounter (Signed)
 Left voicemail to give the office a call back to schedule [facility] reapproval appointment.
